# Patient Record
Sex: Female | Born: 1950 | Race: Black or African American | Hispanic: No | Marital: Single | Smoking: Current some day smoker
Health system: Southern US, Community
[De-identification: ages and names within clinical notes are randomized; demographics above are authoritative.]

## PROBLEM LIST (undated history)

## (undated) DIAGNOSIS — I517 Cardiomegaly: Secondary | ICD-10-CM

## (undated) DIAGNOSIS — I509 Heart failure, unspecified: Secondary | ICD-10-CM

## (undated) DIAGNOSIS — K219 Gastro-esophageal reflux disease without esophagitis: Secondary | ICD-10-CM

## (undated) DIAGNOSIS — I10 Essential (primary) hypertension: Secondary | ICD-10-CM

## (undated) DIAGNOSIS — J209 Acute bronchitis, unspecified: Secondary | ICD-10-CM

## (undated) DIAGNOSIS — R7302 Impaired glucose tolerance (oral): Secondary | ICD-10-CM

## (undated) DIAGNOSIS — Z8639 Personal history of other endocrine, nutritional and metabolic disease: Secondary | ICD-10-CM

## (undated) DIAGNOSIS — D649 Anemia, unspecified: Secondary | ICD-10-CM

## (undated) DIAGNOSIS — F1990 Other psychoactive substance use, unspecified, uncomplicated: Secondary | ICD-10-CM

## (undated) DIAGNOSIS — E785 Hyperlipidemia, unspecified: Secondary | ICD-10-CM

## (undated) DIAGNOSIS — E119 Type 2 diabetes mellitus without complications: Secondary | ICD-10-CM

## (undated) DIAGNOSIS — J449 Chronic obstructive pulmonary disease, unspecified: Secondary | ICD-10-CM

## (undated) DIAGNOSIS — C9590 Leukemia, unspecified not having achieved remission: Secondary | ICD-10-CM

## (undated) DIAGNOSIS — R202 Paresthesia of skin: Secondary | ICD-10-CM

## (undated) DIAGNOSIS — F419 Anxiety disorder, unspecified: Secondary | ICD-10-CM

## (undated) DIAGNOSIS — I447 Left bundle-branch block, unspecified: Secondary | ICD-10-CM

## (undated) DIAGNOSIS — Z780 Asymptomatic menopausal state: Secondary | ICD-10-CM

## (undated) DIAGNOSIS — I639 Cerebral infarction, unspecified: Secondary | ICD-10-CM

## (undated) DIAGNOSIS — R7303 Prediabetes: Secondary | ICD-10-CM

## (undated) HISTORY — PX: CARDIAC CATHETERIZATION: SHX172

## (undated) HISTORY — DX: Personal history of other endocrine, nutritional and metabolic disease: Z86.39

## (undated) HISTORY — DX: Essential (primary) hypertension: I10

## (undated) HISTORY — DX: Chronic obstructive pulmonary disease, unspecified: J44.9

## (undated) HISTORY — DX: Gastro-esophageal reflux disease without esophagitis: K21.9

## (undated) HISTORY — PX: CHOLECYSTECTOMY: SHX55

## (undated) HISTORY — DX: Cerebral infarction, unspecified: I63.9

## (undated) HISTORY — DX: Hyperlipidemia, unspecified: E78.5

## (undated) HISTORY — DX: Type 2 diabetes mellitus without complications: E11.9

## (undated) HISTORY — DX: Paresthesia of skin: R20.2

## (undated) HISTORY — DX: Other psychoactive substance use, unspecified, uncomplicated: F19.90

## (undated) HISTORY — DX: Impaired glucose tolerance (oral): R73.02

## (undated) HISTORY — DX: Cardiomegaly: I51.7

## (undated) HISTORY — DX: Asymptomatic menopausal state: Z78.0

## (undated) HISTORY — DX: Acute bronchitis, unspecified: J20.9

---

## 2009-07-26 HISTORY — PX: CHOLECYSTECTOMY: SHX55

## 2018-04-24 DIAGNOSIS — M19012 Primary osteoarthritis, left shoulder: Secondary | ICD-10-CM

## 2018-04-24 HISTORY — DX: Primary osteoarthritis, left shoulder: M19.012

## 2018-05-01 DIAGNOSIS — M1611 Unilateral primary osteoarthritis, right hip: Secondary | ICD-10-CM

## 2018-05-01 HISTORY — DX: Unilateral primary osteoarthritis, right hip: M16.11

## 2018-08-17 ENCOUNTER — Encounter: Payer: Self-pay | Admitting: *Deleted

## 2018-08-29 ENCOUNTER — Other Ambulatory Visit: Payer: Self-pay

## 2018-08-29 ENCOUNTER — Encounter: Payer: Self-pay | Admitting: Cardiovascular Disease

## 2018-08-29 ENCOUNTER — Ambulatory Visit (INDEPENDENT_AMBULATORY_CARE_PROVIDER_SITE_OTHER): Payer: Medicare Other | Admitting: Cardiovascular Disease

## 2018-08-29 DIAGNOSIS — I519 Heart disease, unspecified: Secondary | ICD-10-CM

## 2018-08-29 DIAGNOSIS — I340 Nonrheumatic mitral (valve) insufficiency: Secondary | ICD-10-CM

## 2018-08-29 DIAGNOSIS — Z720 Tobacco use: Secondary | ICD-10-CM

## 2018-08-29 DIAGNOSIS — I10 Essential (primary) hypertension: Secondary | ICD-10-CM

## 2018-08-29 DIAGNOSIS — I110 Hypertensive heart disease with heart failure: Secondary | ICD-10-CM

## 2018-08-29 HISTORY — DX: Tobacco use: Z72.0

## 2018-08-29 HISTORY — DX: Hypertensive heart disease with heart failure: I11.0

## 2018-08-29 HISTORY — DX: Heart disease, unspecified: I51.9

## 2018-08-29 HISTORY — DX: Nonrheumatic mitral (valve) insufficiency: I34.0

## 2018-08-29 NOTE — Assessment & Plan Note (Signed)
Yvonne Campbell was referred to me by Dr. Claudie Leach for right left heart cath because of severe LV left ventricular dysfunction by 2D echo with an EF of 25 to 30%.  The echo was performed 07/25/2018.  She does have class II symptoms.

## 2018-08-29 NOTE — Assessment & Plan Note (Signed)
25-pack-year tobacco abuse with continuing smoking 1/2 pack/day recalcitrant to risk factor modification.

## 2018-08-29 NOTE — H&P (View-Only) (Signed)
08/29/2018 [place]   May 25, 1951  [id number]  Primary Physician No primary care provider on file. Primary Cardiologist: Lorretta Harp MD Yvonne Campbell, Yvonne Campbell, [place]  HPI:  Yvonne Campbell is a 68 y.o. thin appearing divorced African-American female mother of 36, grandmother of 63 grandchildren who is accompanied by her daughter Yvonne Campbell today.  She was referred by Dr. Claudie Leach for right left heart cath because of symptomatic severe LV dysfunction with severe MR.  Her risk factors include treated hypertension and a long history tobacco abuse.  She is never had a heart attack stroke.  She does complain of progressive dyspnea now one half block.  2D echo performed 07/25/2018 revealed severe LV dysfunction with an EF of 25% and severe MR.   Current Meds  Medication Sig  . carvedilol (COREG) 12.5 MG tablet Take 12.5 mg by mouth 2 (two) times daily.  . cyclobenzaprine (FLEXERIL) 10 MG tablet Take 10 mg by mouth as needed.  Marland Kitchen lisinopril (PRINIVIL,ZESTRIL) 10 MG tablet Take 10 mg by mouth 2 (two) times daily.   Marland Kitchen omeprazole (PRILOSEC) 20 MG capsule Take 20 mg by mouth daily.  . [DISCONTINUED] permethrin (ELIMITE) 5 % cream Apply 1 application topically once.  . [DISCONTINUED] sulfamethoxazole-trimethoprim (BACTRIM DS,SEPTRA DS) 800-160 MG tablet Take 1 tablet by mouth 2 (two) times daily.     Allergies  Allergen Reactions  . Sulfa Antibiotics   . Antiseborrheic Products, Misc. Swelling  . Latex Rash  . Penicillins Swelling    Social History   Socioeconomic History  . Marital status: Single    Spouse name: Not on file  . Number of children: Not on file  . Years of education: Not on file  . Highest education level: Not on file  Occupational History  . Not on file  Social Needs  . Financial resource strain: Not on file  . Food insecurity:    Worry: Not on file    Inability: Not on file  . Transportation needs:    Medical: Not on file    Non-medical: Not on file  Tobacco Use  .  Smoking status: Former Smoker    Last attempt to quit: 07/26/2018    Years since quitting: 0.0  . Smokeless tobacco: Never Used  Substance and Sexual Activity  . Alcohol use: Yes  . Drug use: Never  . Sexual activity: Not on file  Lifestyle  . Physical activity:    Days per week: Not on file    Minutes per session: Not on file  . Stress: Not on file  Relationships  . Social connections:    Talks on phone: Not on file    Gets together: Not on file    Attends religious service: Not on file    Active member of club or organization: Not on file    Attends meetings of clubs or organizations: Not on file    Relationship status: Not on file  . Intimate partner violence:    Fear of current or ex partner: Not on file    Emotionally abused: Not on file    Physically abused: Not on file    Forced sexual activity: Not on file  Other Topics Concern  . Not on file  Social History Narrative  . Not on file     Review of Systems: General: negative for chills, fever, night sweats or weight changes.  Cardiovascular: negative for chest pain, dyspnea on exertion, edema, orthopnea, palpitations, paroxysmal nocturnal dyspnea or shortness  of breath Dermatological: negative for rash Respiratory: negative for cough or wheezing Urologic: negative for hematuria Abdominal: negative for nausea, vomiting, diarrhea, bright red blood per rectum, melena, or hematemesis Neurologic: negative for visual changes, syncope, or dizziness All other systems reviewed and are otherwise negative except as noted above.    Blood pressure 132/74, pulse 63, height 5\' 4"  (1.626 m), weight 129 lb 6.4 oz (58.7 kg).  General appearance: alert and no distress Neck: no adenopathy, no carotid bruit, no JVD, supple, symmetrical, trachea midline and thyroid not enlarged, symmetric, no tenderness/mass/nodules Lungs: clear to auscultation bilaterally Heart: regular rate and rhythm, S1, S2 normal, no murmur, click, rub or  gallop Extremities: extremities normal, atraumatic, no cyanosis or edema Pulses: 2+ and symmetric Skin: Skin color, texture, turgor normal. No rashes or lesions Neurologic: Alert and oriented X 3, normal strength and tone. Normal symmetric reflexes. Normal coordination and gait  EKG sinus rhythm at 63 with severe LVH with repolarization changes.  I personally reviewed this EKG.  ASSESSMENT AND PLAN:   Left ventricular dysfunction Ms. Whittingham was referred to me by Dr. Claudie Leach for right left heart cath because of severe LV left ventricular dysfunction by 2D echo with an EF of 25 to 30%.  The echo was performed 07/25/2018.  She does have class II symptoms.  Severe mitral regurgitation 2D echo performed 07/25/2018 showed severe LV dysfunction with severe MR.  The patient does have class II symptoms.  Will perform outpatient right and left heart cath via the radial/brachial approach.  Tobacco abuse 25-pack-year tobacco abuse with continuing smoking 1/2 pack/day recalcitrant to risk factor modification.  Essential hypertension History of essential hypertension with blood pressure measured today at 132/74.  She is on carvedilol and lisinopril.      Lorretta Harp MD FACP,FACC,FAHA, [facility] 08/29/2018 10:42 AM

## 2018-08-29 NOTE — Assessment & Plan Note (Signed)
History of essential hypertension with blood pressure measured today at 132/74.  She is on carvedilol and lisinopril.

## 2018-08-29 NOTE — Assessment & Plan Note (Signed)
2D echo performed 07/25/2018 showed severe LV dysfunction with severe MR.  The patient does have class II symptoms.  Will perform outpatient right and left heart cath via the radial/brachial approach.

## 2018-08-29 NOTE — Progress Notes (Signed)
08/29/2018 Yvonne Campbell   [birth year]  [id number]  Primary Physician No primary care provider on file. Primary Cardiologist: Lorretta Harp MD Yvonne Campbell, [place], [place]  HPI:  Yvonne Campbell is a 68 y.o. thin appearing divorced African-American female mother of 38, grandmother of 67 grandchildren who is accompanied by her daughter Yvonne Campbell today.  She was referred by Dr. Claudie Campbell for right left heart cath because of symptomatic severe LV dysfunction with severe MR.  Her risk factors include treated hypertension and a long history tobacco abuse.  She is never had a heart attack stroke.  She does complain of progressive dyspnea now one half block.  2D echo performed 07/25/2018 revealed severe LV dysfunction with an EF of 25% and severe MR.   Current Meds  Medication Sig  . carvedilol (COREG) 12.5 MG tablet Take 12.5 mg by mouth 2 (two) times daily.  . cyclobenzaprine (FLEXERIL) 10 MG tablet Take 10 mg by mouth as needed.  Marland Kitchen lisinopril (PRINIVIL,ZESTRIL) 10 MG tablet Take 10 mg by mouth 2 (two) times daily.   Marland Kitchen omeprazole (PRILOSEC) 20 MG capsule Take 20 mg by mouth daily.  . [DISCONTINUED] permethrin (ELIMITE) 5 % cream Apply 1 application topically once.  . [DISCONTINUED] sulfamethoxazole-trimethoprim (BACTRIM DS,SEPTRA DS) 800-160 MG tablet Take 1 tablet by mouth 2 (two) times daily.     Allergies  Allergen Reactions  . Sulfa Antibiotics   . Antiseborrheic Products, Misc. Swelling  . Latex Rash  . Penicillins Swelling    Social History   Socioeconomic History  . Marital status: Single    Spouse name: Not on file  . Number of children: Not on file  . Years of education: Not on file  . Highest education level: Not on file  Occupational History  . Not on file  Social Needs  . Financial resource strain: Not on file  . Food insecurity:    Worry: Not on file    Inability: Not on file  . Transportation needs:    Medical: Not on file    Non-medical: Not on file  Tobacco Use  .  Smoking status: Former Smoker    Last attempt to quit: 07/26/2018    Years since quitting: 0.0  . Smokeless tobacco: Never Used  Substance and Sexual Activity  . Alcohol use: Yes  . Drug use: Never  . Sexual activity: Not on file  Lifestyle  . Physical activity:    Days per week: Not on file    Minutes per session: Not on file  . Stress: Not on file  Relationships  . Social connections:    Talks on phone: Not on file    Gets together: Not on file    Attends religious service: Not on file    Active member of club or organization: Not on file    Attends meetings of clubs or organizations: Not on file    Relationship status: Not on file  . Intimate partner violence:    Fear of current or ex partner: Not on file    Emotionally abused: Not on file    Physically abused: Not on file    Forced sexual activity: Not on file  Other Topics Concern  . Not on file  Social History Narrative  . Not on file     Review of Systems: General: negative for chills, fever, night sweats or weight changes.  Cardiovascular: negative for chest pain, dyspnea on exertion, edema, orthopnea, palpitations, paroxysmal nocturnal dyspnea or shortness  of breath Dermatological: negative for rash Respiratory: negative for cough or wheezing Urologic: negative for hematuria Abdominal: negative for nausea, vomiting, diarrhea, bright red blood per rectum, melena, or hematemesis Neurologic: negative for visual changes, syncope, or dizziness All other systems reviewed and are otherwise negative except as noted above.    Blood pressure 132/74, pulse 63, height 5\' 4"  (1.626 m), weight 129 lb 6.4 oz (58.7 kg).  General appearance: alert and no distress Neck: no adenopathy, no carotid bruit, no JVD, supple, symmetrical, trachea midline and thyroid not enlarged, symmetric, no tenderness/mass/nodules Lungs: clear to auscultation bilaterally Heart: regular rate and rhythm, S1, S2 normal, no murmur, click, rub or  gallop Extremities: extremities normal, atraumatic, no cyanosis or edema Pulses: 2+ and symmetric Skin: Skin color, texture, turgor normal. No rashes or lesions Neurologic: Alert and oriented X 3, normal strength and tone. Normal symmetric reflexes. Normal coordination and gait  EKG sinus rhythm at 63 with severe LVH with repolarization changes.  I personally reviewed this EKG.  ASSESSMENT AND PLAN:   Left ventricular dysfunction Ms. Keizer was referred to me by Dr. Claudie Campbell for right left heart cath because of severe LV left ventricular dysfunction by 2D echo with an EF of 25 to 30%.  The echo was performed 07/25/2018.  She does have class II symptoms.  Severe mitral regurgitation 2D echo performed 07/25/2018 showed severe LV dysfunction with severe MR.  The patient does have class II symptoms.  Will perform outpatient right and left heart cath via the radial/brachial approach.  Tobacco abuse 25-pack-year tobacco abuse with continuing smoking 1/2 pack/day recalcitrant to risk factor modification.  Essential hypertension History of essential hypertension with blood pressure measured today at 132/74.  She is on carvedilol and lisinopril.      Lorretta Harp MD FACP,FACC,FAHA, [facility] 08/29/2018 10:42 AM

## 2018-08-29 NOTE — Patient Instructions (Signed)
    [place] [address] Dept: [phone] Loc: [place]  08/29/2018  You are scheduled for a Cardiac Catheterization on Monday, February 10 with Dr. Quay Burow.  1. Please arrive at the [facility] (Main Entrance A) at [facility]: [address] [place], [place] [address] at 9:30 AM (This time is two hours before your procedure to ensure your preparation). Free valet parking service is available.   Special note: Every effort is made to have your procedure done on time. Please understand that emergencies sometimes delay scheduled procedures.  2. Diet: Do not eat solid foods after midnight.  The patient may have clear liquids until 5am upon the day of the procedure.  3. Labs: You will need to have blood drawn TODAY CBC, BMP, TSH 4. Medication instructions in preparation for your procedure:   On the morning of your procedure, take one tablet of Aspirin 81 MG and any morning medicines NOT listed above.  You may use sips of water.  5. Plan for one night stay--bring personal belongings. 6. Bring a current list of your medications and current insurance cards. 7. You MUST have a responsible person to drive you home. 8. Someone MUST be with you the first 24 hours after you arrive home or your discharge will be delayed. 9. Please wear clothes that are easy to get on and off and wear slip-on shoes.  Thank you for allowing [place] to care for you!   -- [facility]   FOLLOW UP: PLEASE SCHEDULE A FOLLOW UP APPOINTMENT 3 WEEKS AFTER PROCEDURE.

## 2018-08-30 LAB — BASIC METABOLIC PANEL
BUN/Creatinine Ratio: 17 (ref 12–28)
BUN: 17 mg/dL (ref 8–27)
CO2: 23 mmol/L (ref 20–29)
Calcium: 9.3 mg/dL (ref 8.7–10.3)
Chloride: 103 mmol/L (ref 96–106)
Creatinine, Ser: 1.03 mg/dL — ABNORMAL HIGH (ref 0.57–1.00)
GFR calc Af Amer: 65 mL/min/{1.73_m2} (ref >59)
GFR calc non Af Amer: 56 mL/min/{1.73_m2} — ABNORMAL LOW (ref >59)
Glucose: 72 mg/dL (ref 65–99)
Potassium: 4.9 mmol/L (ref 3.5–5.2)
Sodium: 144 mmol/L (ref 134–144)

## 2018-08-30 LAB — CBC
Hematocrit: 40.9 % (ref 34.0–46.6)
Hemoglobin: 13.5 g/dL (ref 11.1–15.9)
MCH: 30.5 pg (ref 26.6–33.0)
MCHC: 33 g/dL (ref 31.5–35.7)
MCV: 92 fL (ref 79–97)
PLATELETS: 303 10*3/uL (ref 150–450)
RBC: 4.43 x10E6/uL (ref 3.77–5.28)
RDW: 12.7 % (ref 11.7–15.4)
WBC: 5.4 10*3/uL (ref 3.4–10.8)

## 2018-08-30 LAB — TSH: TSH: 0.932 u[IU]/mL (ref 0.450–4.500)

## 2018-09-04 ENCOUNTER — Encounter (HOSPITAL_COMMUNITY)
Admission: RE | Disposition: A | Discharge status: Home / Self Care | Payer: Self-pay | Source: Home / Self Care | Attending: Cardiovascular Disease

## 2018-09-04 ENCOUNTER — Ambulatory Visit (HOSPITAL_COMMUNITY)
Admission: RE | Admit: 2018-09-04 | Discharge: 2018-09-04 | Disposition: A | Discharge status: Home / Self Care | Payer: Medicare Other | Attending: Cardiovascular Disease | Admitting: Cardiovascular Disease

## 2018-09-04 ENCOUNTER — Other Ambulatory Visit: Payer: Self-pay

## 2018-09-04 DIAGNOSIS — Z880 Allergy status to penicillin: Secondary | ICD-10-CM | POA: Insufficient documentation

## 2018-09-04 DIAGNOSIS — Z87891 Personal history of nicotine dependence: Secondary | ICD-10-CM | POA: Diagnosis not present

## 2018-09-04 DIAGNOSIS — R0600 Dyspnea, unspecified: Secondary | ICD-10-CM | POA: Diagnosis not present

## 2018-09-04 DIAGNOSIS — Z79899 Other long term (current) drug therapy: Secondary | ICD-10-CM | POA: Diagnosis not present

## 2018-09-04 DIAGNOSIS — I119 Hypertensive heart disease without heart failure: Secondary | ICD-10-CM | POA: Diagnosis present

## 2018-09-04 DIAGNOSIS — Z882 Allergy status to sulfonamides status: Secondary | ICD-10-CM | POA: Diagnosis not present

## 2018-09-04 DIAGNOSIS — Z91040 Latex allergy status: Secondary | ICD-10-CM | POA: Insufficient documentation

## 2018-09-04 DIAGNOSIS — I519 Heart disease, unspecified: Secondary | ICD-10-CM | POA: Diagnosis not present

## 2018-09-04 DIAGNOSIS — I051 Rheumatic mitral insufficiency: Secondary | ICD-10-CM | POA: Diagnosis not present

## 2018-09-04 HISTORY — PX: RIGHT/LEFT HEART CATH AND CORONARY ANGIOGRAPHY: CATH118266

## 2018-09-04 LAB — POCT I-STAT EG7
Bicarbonate: 26.4 mmol/L (ref 20.0–28.0)
Calcium, Ion: 1.25 mmol/L (ref 1.15–1.40)
HCT: 36 % (ref 36.0–46.0)
Hemoglobin: 12.2 g/dL (ref 12.0–15.0)
O2 Saturation: 73 %
Potassium: 3.9 mmol/L (ref 3.5–5.1)
SODIUM: 140 mmol/L (ref 135–145)
TCO2: 28 mmol/L (ref 22–32)
pCO2, Ven: 48.8 mmHg (ref 44.0–60.0)
pH, Ven: 7.342 (ref 7.250–7.430)
pO2, Ven: 42 mmHg (ref 32.0–45.0)

## 2018-09-04 LAB — POCT I-STAT 7, (LYTES, BLD GAS, ICA,H+H)
Bicarbonate: 25.2 mmol/L (ref 20.0–28.0)
Calcium, Ion: 1.24 mmol/L (ref 1.15–1.40)
HCT: 36 % (ref 36.0–46.0)
Hemoglobin: 12.2 g/dL (ref 12.0–15.0)
O2 Saturation: 97 %
PH ART: 7.362 (ref 7.350–7.450)
Potassium: 4 mmol/L (ref 3.5–5.1)
Sodium: 141 mmol/L (ref 135–145)
TCO2: 27 mmol/L (ref 22–32)
pCO2 arterial: 44.4 mmHg (ref 32.0–48.0)
pO2, Arterial: 98 mmHg (ref 83.0–108.0)

## 2018-09-04 LAB — GLUCOSE, CAPILLARY: Glucose-Capillary: 91 mg/dL (ref 70–99)

## 2018-09-04 SURGERY — RIGHT/LEFT HEART CATH AND CORONARY ANGIOGRAPHY
Anesthesia: LOCAL

## 2018-09-04 MED ORDER — SODIUM CHLORIDE 0.9 % IV SOLN: INTRAVENOUS | Status: AC

## 2018-09-04 MED ORDER — HEPARIN SODIUM (PORCINE) 1000 UNIT/ML IJ SOLN
INTRAMUSCULAR | Status: AC
  Filled 2018-09-04: qty 1

## 2018-09-04 MED ORDER — HEPARIN (PORCINE) IN NACL 1000-0.9 UT/500ML-% IV SOLN
INTRAVENOUS | Status: DC | PRN
  Administered 2018-09-04: 500 mL

## 2018-09-04 MED ORDER — SODIUM CHLORIDE 0.9% FLUSH: 3.0000 mL | INTRAVENOUS | Status: DC | PRN

## 2018-09-04 MED ORDER — MIDAZOLAM HCL 2 MG/2ML IJ SOLN
INTRAMUSCULAR | Status: AC
  Filled 2018-09-04: qty 2

## 2018-09-04 MED ORDER — LIDOCAINE HCL (PF) 1 % IJ SOLN
INTRAMUSCULAR | Status: DC | PRN
  Administered 2018-09-04: 10 mL

## 2018-09-04 MED ORDER — ASPIRIN 81 MG PO CHEW: 81.0000 mg | CHEWABLE_TABLET | Freq: Every day | ORAL | Status: DC

## 2018-09-04 MED ORDER — VERAPAMIL HCL 2.5 MG/ML IV SOLN
INTRAVENOUS | Status: DC | PRN
  Administered 2018-09-04: 10 mL via INTRA_ARTERIAL

## 2018-09-04 MED ORDER — SODIUM CHLORIDE 0.9% FLUSH: 3.0000 mL | Freq: Two times a day (BID) | INTRAVENOUS | Status: DC

## 2018-09-04 MED ORDER — ACETAMINOPHEN 325 MG PO TABS: 650.0000 mg | ORAL_TABLET | ORAL | Status: DC | PRN

## 2018-09-04 MED ORDER — FENTANYL CITRATE (PF) 100 MCG/2ML IJ SOLN
INTRAMUSCULAR | Status: AC
  Filled 2018-09-04: qty 2

## 2018-09-04 MED ORDER — MIDAZOLAM HCL 2 MG/2ML IJ SOLN
INTRAMUSCULAR | Status: DC | PRN
  Administered 2018-09-04: 1 mg via INTRAVENOUS

## 2018-09-04 MED ORDER — SODIUM CHLORIDE 0.9 % WEIGHT BASED INFUSION: 1.0000 mL/kg/h | INTRAVENOUS | Status: DC

## 2018-09-04 MED ORDER — ONDANSETRON HCL 4 MG/2ML IJ SOLN: 4.0000 mg | Freq: Four times a day (QID) | INTRAMUSCULAR | Status: DC | PRN

## 2018-09-04 MED ORDER — LIDOCAINE HCL (PF) 1 % IJ SOLN
INTRAMUSCULAR | Status: AC
  Filled 2018-09-04: qty 30

## 2018-09-04 MED ORDER — ASPIRIN 81 MG PO CHEW: 81.0000 mg | CHEWABLE_TABLET | ORAL | Status: DC

## 2018-09-04 MED ORDER — FENTANYL CITRATE (PF) 100 MCG/2ML IJ SOLN
INTRAMUSCULAR | Status: DC | PRN
  Administered 2018-09-04: 25 ug via INTRAVENOUS

## 2018-09-04 MED ORDER — HEPARIN (PORCINE) IN NACL 1000-0.9 UT/500ML-% IV SOLN
INTRAVENOUS | Status: AC
  Filled 2018-09-04: qty 1000

## 2018-09-04 MED ORDER — SODIUM CHLORIDE 0.9 % IV SOLN: 250.0000 mL | INTRAVENOUS | Status: DC | PRN

## 2018-09-04 MED ORDER — SODIUM CHLORIDE 0.9 % WEIGHT BASED INFUSION
3.0000 mL/kg/h | INTRAVENOUS | Status: AC
  Administered 2018-09-04: 3 mL/kg/h via INTRAVENOUS

## 2018-09-04 SURGICAL SUPPLY — 15 items
CATH BALLN WEDGE 5F 110CM (CATHETERS) ×4 IMPLANT
CATH INFINITI 5FR ANG PIGTAIL (CATHETERS) ×2 IMPLANT
CATH OPTITORQUE TIG 4.0 5F (CATHETERS) ×2 IMPLANT
DEVICE RAD COMP TR BAND LRG (VASCULAR PRODUCTS) ×2 IMPLANT
GLIDESHEATH SLEND A-KIT 6F 22G (SHEATH) ×2 IMPLANT
GUIDEWIRE INQWIRE 1.5J.035X260 (WIRE) ×1 IMPLANT
INQWIRE 1.5J .035X260CM (WIRE) ×2
KIT HEART LEFT (KITS) ×2 IMPLANT
PACK CARDIAC CATHETERIZATION (CUSTOM PROCEDURE TRAY) ×2 IMPLANT
SHEATH GLIDE SLENDER 4/5FR (SHEATH) ×2 IMPLANT
SHEATH PROBE COVER 6X72 (BAG) ×2 IMPLANT
SYR MEDRAD MARK 7 150ML (SYRINGE) ×2 IMPLANT
TRANSDUCER W/STOPCOCK (MISCELLANEOUS) ×2 IMPLANT
TUBING CIL FLEX 10 FLL-RA (TUBING) ×2 IMPLANT
WIRE HI TORQ VERSACORE-J 145CM (WIRE) ×2 IMPLANT

## 2018-09-04 NOTE — Interval H&P Note (Signed)
Cath Lab Visit (complete for each Cath Lab visit)  Clinical Evaluation Leading to the Procedure:   ACS: No.  Non-ACS:    Anginal Classification: CCS I  Anti-ischemic medical therapy: No Therapy  Non-Invasive Test Results: No non-invasive testing performed  Prior CABG: No previous CABG      History and Physical Interval Note:  09/04/2018 12:12 PM  [facility]  has presented today for surgery, with the diagnosis of lv disfunction  The various methods of treatment have been discussed with the patient and family. After consideration of risks, benefits and other options for treatment, the patient has consented to  Procedure(s): RIGHT/LEFT HEART CATH AND CORONARY ANGIOGRAPHY (N/A) as a surgical intervention .  The patient's history has been reviewed, patient examined, no change in status, stable for surgery.  I have reviewed the patient's chart and labs.  Questions were answered to the patient's satisfaction.     Quay Burow

## 2018-09-04 NOTE — Discharge Instructions (Signed)

## 2018-09-05 ENCOUNTER — Encounter (HOSPITAL_COMMUNITY): Payer: Self-pay | Admitting: Cardiovascular Disease

## 2018-09-26 ENCOUNTER — Ambulatory Visit: Payer: Medicare Other | Admitting: Cardiovascular Disease

## 2018-09-27 ENCOUNTER — Encounter: Payer: Self-pay | Admitting: *Deleted

## 2018-10-23 DIAGNOSIS — J189 Pneumonia, unspecified organism: Secondary | ICD-10-CM

## 2018-10-23 HISTORY — DX: Pneumonia, unspecified organism: J18.9

## 2018-11-07 ENCOUNTER — Ambulatory Visit: Payer: Medicare Other | Admitting: Cardiovascular Disease

## 2018-11-08 ENCOUNTER — Ambulatory Visit: Payer: Medicare Other | Admitting: Cardiovascular Disease

## 2018-11-22 DIAGNOSIS — R040 Epistaxis: Secondary | ICD-10-CM

## 2018-11-22 HISTORY — DX: Epistaxis: R04.0

## 2018-12-26 ENCOUNTER — Telehealth: Payer: Self-pay | Admitting: *Deleted

## 2018-12-26 NOTE — Telephone Encounter (Signed)
REFERRAL SENT TO SCHEDULING AND NOTES ON FILE FROM [facility] DR. RAYMOND ROSARIO  [phone]. °

## 2019-01-04 NOTE — Progress Notes (Signed)
Cardiology Office Note:    Date:  01/05/2019   ID:  Yvonne Campbell, Yvonne Campbell [birth year], MRN [id number]  PCP:  Yvonne Mimes, MD  Cardiologist:  Yvonne More, MD   Referring MD: Yvonne Mimes, MD  ASSESSMENT:    1. Left ventricular dysfunction   2. Hypertensive heart disease with heart failure ([place])   3. Severe mitral regurgitation    PLAN:    In order of problems listed above:  1. Left ventricular dysfunction she has a severe nonischemic cardiomyopathy in the setting of longstanding hypertension.  Her home blood pressure still running greater than 150 and I will add this distal diuretic spironolactone to her guideline directed beta-blocker and Entresto I suspect blood pressure will come into line and there is additional benefit in terms of systolic heart failure today check renal function proBNP and recheck renal function in 2 weeks.  She is well compensated at the time of her heart catheterization with a mean pulmonary capillary wedge pressure of 11.  Unless she had significant ventricular arrhythmia I would not advise an ICD in a woman with a nonischemic cardiomyopathy 2. Hypertension remains elevated at MRA 3. Mitral regurgitation severe she is [place] Heart Association class I at this time I would not consider a mitral clip I suspect that she will have a follow-up echocardiogram performed when she sees Dr. Claudie Campbell LE further follow-up with me as needed.  Next appointment   Medication Adjustments/Labs and Tests Ordered: Current medicines are reviewed at length with the patient today.  Concerns regarding medicines are outlined above.  No orders of the defined types were placed in this encounter.  No orders of the defined types were placed in this encounter.    No chief complaint on file.   History of Present Illness:    Yvonne Campbell is a 68 y.o. female who is being seen today for the evaluation of heart failure at the request of Yvonne Mimes, MD. PMH HTN,  tobacco abuse, severe LV dysfunction, severe MR.   She had R and L heart cath 09/04/18 with Dr berry which showed normal coronary arteries, severe LV systolic dysfunction EF less than 25% and LV end diastolic pressure moderately elevated. Echocardiogram 07/25/2018 EF 25%, severe LV dysfunction, severe MR.  She had an appointment to see Dr. Alvester Chou after her heart catheterization she is in my office hours today.  Overall she is really doing quite well she is pleased with the quality of her life does not need a diuretic and is not having edema shortness of breath or exercise intolerance she walks a mile each day.  She is on good guideline directed therapy and that and added MRA with her elevated blood pressure she is wearing a 30-day event monitor to screen for ventricular arrhythmia and she tells me she will be seeing Dr. Claudie Campbell in the next month and plans to have a repeat echocardiogram I will leave further follow-up in my office as needed. Past Medical History:  Diagnosis Date  . Acute bronchitis   . Diabetes mellitus without complication ([place])   . Drug use   . Hyperlipidemia   . Hypertension   . Impaired glucose tolerance   . LVH (left ventricular hypertrophy)   . Menopause     Past Surgical History:  Procedure Laterality Date  . CARDIAC CATHETERIZATION    . CESAREAN SECTION    . CHOLECYSTECTOMY    . RIGHT/LEFT HEART CATH AND CORONARY ANGIOGRAPHY N/A 09/04/2018   Procedure: RIGHT/LEFT  HEART CATH AND CORONARY ANGIOGRAPHY;  Surgeon: Lorretta Harp, MD;  Location: [facility];  Service: Cardiovascular;  Laterality: N/A;    Current Medications: Current Meds  Medication Sig  . albuterol (VENTOLIN HFA) 108 (90 Base) MCG/ACT inhaler Inhale 1-2 puffs into the lungs every 4 (four) hours as needed.  Marland Kitchen aspirin EC 81 MG tablet Take 1 tablet by mouth daily.  . carvedilol (COREG) 12.5 MG tablet Take 12.5 mg by mouth 2 (two) times daily.  . cetirizine (ZYRTEC) 10 MG tablet Take 10 mg by  mouth daily.  . cyclobenzaprine (FLEXERIL) 10 MG tablet Take 10 mg by mouth 3 (three) times daily as needed for muscle spasms.   Marland Kitchen ENTRESTO 97-103 MG Take 1 tablet by mouth 2 (two) times a day.  . hydrOXYzine (ATARAX/VISTARIL) 25 MG tablet Take 1 tablet by mouth at bedtime.  Marland Kitchen omeprazole (PRILOSEC) 20 MG capsule Take 20 mg by mouth daily as needed.   Marland Kitchen PROAIR HFA 108 (90 Base) MCG/ACT inhaler 2 puffs every 6 (six) hours as needed.   Marland Kitchen SINGULAIR 10 MG tablet Take 1 tablet by mouth daily.  . sodium chloride (ALTAMIST SPRAY) 0.65 % nasal spray Place 2 sprays into the nose as needed.      Allergies:   Antiseborrheic products, misc.; Latex; Penicillins; and Sulfa antibiotics   Social History   Socioeconomic History  . Marital status: Single    Spouse name: Not on file  . Number of children: Not on file  . Years of education: Not on file  . Highest education level: Not on file  Occupational History  . Not on file  Social Needs  . Financial resource strain: Not on file  . Food insecurity    Worry: Not on file    Inability: Not on file  . Transportation needs    Medical: Not on file    Non-medical: Not on file  Tobacco Use  . Smoking status: Former Smoker    Packs/day: 0.50    Years: 35.00    Pack years: 17.50    Types: Cigarettes    Quit date: 10/2018    Years since quitting: 0.1  . Smokeless tobacco: Never Used  Substance and Sexual Activity  . Alcohol use: Not Currently  . Drug use: Yes    Types: Marijuana  . Sexual activity: Not on file  Lifestyle  . Physical activity    Days per week: Not on file    Minutes per session: Not on file  . Stress: Not on file  Relationships  . Social Herbalist on phone: Not on file    Gets together: Not on file    Attends religious service: Not on file    Active member of club or organization: Not on file    Attends meetings of clubs or organizations: Not on file    Relationship status: Not on file  Other Topics Concern  .  Not on file  Social History Narrative  . Not on file     Family History: The patient's family history includes Breast cancer in her mother; Heart attack in her paternal grandfather; Hypertension in her mother and paternal grandfather.  ROS:   ROS Please see the history of present illness.     All other systems reviewed and are negative.  EKGs/Labs/Other Studies Reviewed:    The following studies were reviewed today:  Cardiac catheterization 09/04/18: IMPRESSION: Ms. Feria has a dilated nonischemic cardiomyopathy with an EF in  the 20% range, clean coronary arteries, cardiac index of 3 L/min/m and fairly normal filling pressures.  Medical therapy will be recommended.  The right radial artery sheath was removed and a TR band was placed on the right wrist to achieve patent hemostasis.  The patient left the lab in stable condition.  She will be discharged home later today as an outpatient and follow-up with Dr. Claudie Campbell who has been notified of these results.  Findings Left VentricleThe left ventricle is dilated. There is severe left ventricular systolic dysfunction. LV end diastolic pressure is moderately elevated. The left ventricular ejection fraction is less than 25% by visual estimate. There are LV function abnormalities.  Right Heart Pressures Right atrial pressure- 7/6 Right ventricular pressure- 38/2 Pulmonary artery pressure-36/11, mean 20 Pulmonary wedge pressure-A-wave 15, V wave 15, mean 11 LVEDP- 17 Cardiac output- 4.92 L/min    EKG:  EKG is  ordered today.  The ekg ordered today is personally reviewed and demonstrates [place] LBBB repolarization changes  Recent Labs: 08/29/2018: BUN 17; Creatinine, Ser 1.03; Platelets 303; TSH 0.932 09/04/2018: Hemoglobin 12.2; Potassium 4.0; Sodium 141  Recent Lipid Panel No results found for: CHOL, TRIG, HDL, CHOLHDL, VLDL, LDLCALC, LDLDIRECT  Physical Exam:    VS:  BP (!) 168/88 (BP Location: Left Arm, Patient Position: Sitting, Cuff Size:  Normal)   Pulse 68   Temp (!) 97.2 F (36.2 C)   Ht 5\' 4"  (1.626 m)   Wt 129 lb 6.4 oz (58.7 kg)   SpO2 98%   BMI 22.21 kg/m     Wt Readings from Last 3 Encounters:  01/05/19 129 lb 6.4 oz (58.7 kg)  09/04/18 129 lb (58.5 kg)  08/29/18 129 lb 6.4 oz (58.7 kg)     GEN:  Well nourished, well developed in no acute distress HEENT: Normal NECK: No JVD; No carotid bruits LYMPHATICS: No lymphadenopathy CARDIAC: S2 paradoxical RRR, no murmurs, rubs, gallops RESPIRATORY:  Clear to auscultation without rales, wheezing or rhonchi  ABDOMEN: Soft, non-tender, non-distended MUSCULOSKELETAL:  No edema; No deformity  SKIN: Warm and dry NEUROLOGIC:  Alert and oriented x 3 PSYCHIATRIC:  Normal affect     Signed, Yvonne More, MD  01/05/2019 4:00 PM    [facility]

## 2019-01-05 ENCOUNTER — Encounter: Payer: Self-pay | Admitting: Cardiology

## 2019-01-05 ENCOUNTER — Encounter: Payer: Self-pay | Admitting: *Deleted

## 2019-01-05 ENCOUNTER — Other Ambulatory Visit: Payer: Self-pay

## 2019-01-05 ENCOUNTER — Ambulatory Visit (INDEPENDENT_AMBULATORY_CARE_PROVIDER_SITE_OTHER): Payer: Medicare Other | Admitting: Cardiology

## 2019-01-05 VITALS — BP 168/88 | HR 68 | Temp 97.2°F | Ht 64.0 in | Wt 129.4 lb

## 2019-01-05 DIAGNOSIS — I110 Hypertensive heart disease with heart failure: Secondary | ICD-10-CM | POA: Diagnosis not present

## 2019-01-05 DIAGNOSIS — I340 Nonrheumatic mitral (valve) insufficiency: Secondary | ICD-10-CM

## 2019-01-05 DIAGNOSIS — I519 Heart disease, unspecified: Secondary | ICD-10-CM

## 2019-01-05 MED ORDER — SPIRONOLACTONE 25 MG PO TABS: 25.0000 mg | ORAL_TABLET | Freq: Every day | ORAL | 2 refills | Status: DC

## 2019-01-05 NOTE — Patient Instructions (Addendum)
Medication Instructions:  Your physician has recommended you make the following change in your medication:   START: Spironolactone 25mg  (1 Tab) daily  If you need a refill on your cardiac medications before your next appointment, please call your pharmacy.   Lab work: Your physician recommends that you return for lab work in: TODAY BMP,Pro BNP  IN 2 WEEKS: BMP  If you have labs (blood work) drawn today and your tests are completely normal, you will receive your results only by: Marland Kitchen MyChart Message (if you have MyChart) OR . A paper copy in the mail If you have any lab test that is abnormal or we need to change your treatment, we will call you to review the results.  Testing/Procedures: None  Follow-Up: At [facility], you and your health needs are our priority.  As part of our continuing mission to provide you with exceptional heart care, we have created designated Provider Care Teams.  These Care Teams include your primary Cardiologist (physician) and Advanced Practice Providers (APPs -  Physician Assistants and Nurse Practitioners) who all work together to provide you with the care you need, when you need it. You will need a follow up appointment as needed Any Other Special Instructions Will Be Listed Below (If Applicable).   Spironolactone tablets What is this medicine? SPIRONOLACTONE (speer on oh LAK tone) is a diuretic. It helps you make more urine and to lose excess water from your body. This medicine is used to treat high blood pressure, and edema or swelling from heart, kidney, or liver disease. It is also used to treat patients who make too much aldosterone or have low potassium. This medicine may be used for other purposes; ask your health care provider or pharmacist if you have questions. COMMON BRAND NAME(S): Aldactone What should I tell my health care provider before I take this medicine? They need to know if you have any of these conditions: -high blood level of  potassium -kidney disease or trouble making urine -liver disease -an unusual or allergic reaction to spironolactone, other medicines, foods, dyes, or preservatives -pregnant or trying to get pregnant -breast-feeding How should I use this medicine? Take this medicine by mouth with a drink of water. Follow the directions on your prescription label. You can take it with or without food. If it upsets your stomach, take it with food. Do not take your medicine more often than directed. Remember that you will need to pass more urine after taking this medicine. Do not take your doses at a time of day that will cause you problems. Do not take at bedtime. Talk to your pediatrician regarding the use of this medicine in children. While this drug may be prescribed for selected conditions, precautions do apply. Overdosage: If you think you have taken too much of this medicine contact a poison control center or emergency room at once. NOTE: This medicine is only for you. Do not share this medicine with others. What if I miss a dose? If you miss a dose, take it as soon as you can. If it is almost time for your next dose, take only that dose. Do not take double or extra doses. What may interact with this medicine? Do not take this medicine with any of the following medications: -cidofovir -eplerenone -tranylcypromine This medicine may also interact with the following medications: -aspirin -certain medicines for blood pressure or heart disease like benazepril, lisinopril, losartan, valsartan -certain medicines that treat or prevent blood clots like heparin and enoxaparin -cholestyramine -  cyclosporine -digoxin -lithium -medicines that relax muscles for surgery -NSAIDs, medicines for pain and inflammation, like ibuprofen or naproxen -other diuretics -potassium supplements -steroid medicines like prednisone or cortisone -trimethoprim This list may not describe all possible interactions. Give your health  care provider a list of all the medicines, herbs, non-prescription drugs, or dietary supplements you use. Also tell them if you smoke, drink alcohol, or use illegal drugs. Some items may interact with your medicine. What should I watch for while using this medicine? Visit your doctor or health care professional for regular checks on your progress. Check your blood pressure as directed. Ask your doctor what your blood pressure should be, and when you should contact them. You may need to be on a special diet while taking this medicine. Ask your doctor. Also, ask how many glasses of fluid you need to drink a day. You must not get dehydrated. This medicine may make you feel confused, dizzy or lightheaded. Drinking alcohol and taking some medicines can make this worse. Do not drive, use machinery, or do anything that needs mental alertness until you know how this medicine affects you. Do not sit or stand up quickly. What side effects may I notice from receiving this medicine? Side effects that you should report to your doctor or health care professional as soon as possible: -allergic reactions such as skin rash or itching, hives, swelling of the lips, mouth, tongue, or throat -black or tarry stools -fast, irregular heartbeat -fever -muscle pain, cramps -numbness, tingling in hands or feet -trouble breathing -trouble passing urine -unusual bleeding -unusually weak or tired Side effects that usually do not require medical attention (report to your doctor or health care professional if they continue or are bothersome): -change in voice or hair growth -confusion -dizzy, drowsy -dry mouth, increased thirst -enlarged or tender breasts -headache -irregular menstrual periods -sexual difficulty, unable to have an erection -stomach upset This list may not describe all possible side effects. Call your doctor for medical advice about side effects. You may report side effects to FDA at  1-800-FDA-1088. Where should I keep my medicine? Keep out of the reach of children. Store below 25 degrees C (77 degrees F). Throw away any unused medicine after the expiration date. NOTE: This sheet is a summary. It may not cover all possible information. If you have questions about this medicine, talk to your doctor, pharmacist, or health care provider.  2019 Elsevier/Gold Standard (2016-11-26 09:42:28)

## 2019-01-06 LAB — BASIC METABOLIC PANEL
BUN/Creatinine Ratio: 17 (ref 12–28)
BUN: 18 mg/dL (ref 8–27)
CO2: 21 mmol/L (ref 20–29)
Calcium: 9.5 mg/dL (ref 8.7–10.3)
Chloride: 104 mmol/L (ref 96–106)
Creatinine, Ser: 1.07 mg/dL — ABNORMAL HIGH (ref 0.57–1.00)
GFR calc Af Amer: 62 mL/min/{1.73_m2} (ref >59)
GFR calc non Af Amer: 53 mL/min/{1.73_m2} — ABNORMAL LOW (ref >59)
Glucose: 76 mg/dL (ref 65–99)
Potassium: 4.3 mmol/L (ref 3.5–5.2)
Sodium: 141 mmol/L (ref 134–144)

## 2019-01-06 LAB — PRO B NATRIURETIC PEPTIDE: NT-Pro BNP: 2302 pg/mL — ABNORMAL HIGH (ref 0–301)

## 2019-12-06 LAB — PULMONARY FUNCTION TEST

## 2020-04-22 LAB — PULMONARY FUNCTION TEST

## 2020-05-01 ENCOUNTER — Other Ambulatory Visit: Payer: Self-pay | Admitting: Orthopedic Surgery

## 2020-05-01 ENCOUNTER — Other Ambulatory Visit: Payer: Self-pay | Admitting: Student

## 2020-05-01 DIAGNOSIS — M25511 Pain in right shoulder: Secondary | ICD-10-CM

## 2020-05-12 ENCOUNTER — Ambulatory Visit
Admission: RE | Admit: 2020-05-12 | Discharge: 2020-05-12 | Disposition: A | Discharge status: Home / Self Care | Payer: Medicare Other | Source: Ambulatory Visit | Attending: Orthopedic Surgery | Admitting: Orthopedic Surgery

## 2020-05-12 ENCOUNTER — Other Ambulatory Visit: Payer: Self-pay

## 2020-05-12 DIAGNOSIS — M25511 Pain in right shoulder: Secondary | ICD-10-CM

## 2020-06-11 ENCOUNTER — Telehealth: Payer: Self-pay

## 2020-06-11 NOTE — Telephone Encounter (Signed)
   Primary Cardiologist: Shirlee More, MD  Chart reviewed as part of pre-operative protocol coverage. Because of [facility] M Burleson's past medical history and time since last visit, she will require a follow-up visit in order to better assess preoperative cardiovascular risk.  Pre-op covering staff: - Please schedule appointment and call patient to inform them. If patient already had an upcoming appointment within acceptable timeframe, please add "pre-op clearance" to the appointment notes so provider is aware. - Please contact requesting surgeon's office via preferred method (i.e, phone, fax) to inform them of need for appointment prior to surgery.  If applicable, this message will also be routed to pharmacy pool and/or primary cardiologist for input on holding anticoagulant/antiplatelet agent as requested below so that this information is available to the clearing provider at time of patient's appointment.   [place], [place]  06/11/2020, 4:59 PM

## 2020-06-11 NOTE — Telephone Encounter (Signed)
    [facility] Pre-operative Risk Assessment    Request for surgical clearance:  1. What type of surgery is being performed? Right Reverse Total Shoulder   2. When is this surgery scheduled? TBD   3. What type of clearance is required (medical clearance vs. Pharmacy clearance to hold med vs. Both)? Surgical  4. Are there any medications that need to be held prior to surgery and how long?None   5. Practice name and name of physician performing surgery? [facility]/Dr. Veverly Fells   6. What is your office phone number: N/A    7.   What is your office fax number:[phone]  8.   Anesthesia type (None, local, MAC, general) ? General   Yvonne Campbell 06/11/2020, 4:16 PM  _________________________________________________________________   (provider comments below)

## 2020-06-11 NOTE — Telephone Encounter (Signed)
Left message for the pt to call back to schedule a pre op appt with Dr. Bettina Gavia oer APP.

## 2020-06-13 ENCOUNTER — Telehealth: Payer: Self-pay | Admitting: Cardiology

## 2020-06-13 NOTE — Telephone Encounter (Signed)
LVM for pt to CB to schedule pre op clearance  appt/kbl

## 2020-06-16 NOTE — Telephone Encounter (Signed)
Yvonne Campbell, CMA I called patient to schedule. She did not answer/left message for her to call office to schedule preop appt.

## 2020-06-16 NOTE — Telephone Encounter (Signed)
Message sent to Dr. Arloa Koh office to please assist in making pre op appt.

## 2020-06-16 NOTE — Telephone Encounter (Signed)
Patient called to return Johnna's call, connected call to [facility].

## 2020-06-17 ENCOUNTER — Telehealth: Payer: Self-pay | Admitting: Cardiology

## 2020-06-17 NOTE — Telephone Encounter (Signed)
See previous encounter

## 2020-06-17 NOTE — Telephone Encounter (Signed)
Patient called and said her arm surgery has been scheduled for Monday 06/23/20. She wanted to know if Dr. Bettina Gavia would clear her for her surgery sooner. Please advise

## 2020-06-17 NOTE — Telephone Encounter (Signed)
After receiving the pts message I reviewed Dr. Joya Gaskins schedule but cannot locate an appt [facility] or [place] prior to her surgery 06/23/20 for her clearance.. will forward to the [facility] and his nurse for review.

## 2020-06-25 DIAGNOSIS — F1990 Other psychoactive substance use, unspecified, uncomplicated: Secondary | ICD-10-CM | POA: Insufficient documentation

## 2020-06-25 DIAGNOSIS — J209 Acute bronchitis, unspecified: Secondary | ICD-10-CM | POA: Insufficient documentation

## 2020-06-25 DIAGNOSIS — E785 Hyperlipidemia, unspecified: Secondary | ICD-10-CM | POA: Insufficient documentation

## 2020-06-25 DIAGNOSIS — E119 Type 2 diabetes mellitus without complications: Secondary | ICD-10-CM | POA: Insufficient documentation

## 2020-06-25 DIAGNOSIS — Z780 Asymptomatic menopausal state: Secondary | ICD-10-CM | POA: Insufficient documentation

## 2020-06-25 DIAGNOSIS — R7302 Impaired glucose tolerance (oral): Secondary | ICD-10-CM | POA: Insufficient documentation

## 2020-06-25 DIAGNOSIS — I517 Cardiomegaly: Secondary | ICD-10-CM | POA: Insufficient documentation

## 2020-06-25 DIAGNOSIS — I10 Essential (primary) hypertension: Secondary | ICD-10-CM | POA: Insufficient documentation

## 2020-07-01 NOTE — Progress Notes (Signed)
Cardiology Office Note:    Date:  07/02/2020   ID:  Yvonne Campbell, Yvonne Campbell [birth year], MRN [id number]  PCP:  Franchot Mimes, MD  Cardiologist:  Shirlee More, MD    Referring MD: Franchot Mimes, MD    ASSESSMENT:    1. Preoperative cardiovascular examination   2. Hypertensive heart disease with heart failure ([place])   3. Hypertension, unspecified type   4. Severe mitral regurgitation    PLAN:    In order of problems listed above:  1. Despite the severity of her heart disease she is well compensated optimized for planned surgical procedure 2. Good response to guideline directed therapy continue MRA Entresto beta-blocker I would not uptitrate with a heart rate of 60 she is no longer on a loop diuretic and its critical get the results of her echocardiogram and she may require consideration of either mitral clip if regurgitation remains severe and optimal therapy and/or ICD. I plan to see her back in 6 weeks to be able to pull things together   Next appointment: 6 weeks with me   Medication Adjustments/Labs and Tests Ordered: Current medicines are reviewed at length with the patient today.  Concerns regarding medicines are outlined above.  Orders Placed This Encounter  Procedures  . EKG 12-Lead   No orders of the defined types were placed in this encounter.   Chief Complaint  Patient presents with  . Follow-up  . Congestive Heart Failure  . Pre-op Exam    History of Present Illness:    Yvonne Campbell is a 69 y.o. 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 Echocardiogram 07/25/2018 EF 25%, severe LV dysfunction, severe MR   Compliance with diet, lifestyle and medications: Yes  She  is being scheduled for total shoulder arthroplasty. Request to see me for preoperative evaluation. She is done exceptionally well with guideline directed therapy for cardiomyopathy and heart failure not on a loop diuretic and she is New York Heart Association class I exercise tolerance exceeds 7 METS. She thinks she had an echocardiogram performed as an outpatient [place] October.  Preoperative cardiovascular evaluation summary  Surgeon: Dr. Wiliam Ke Procedure: Shoulder surgery, the patient says she is having a total shoulder arthroplasty The surgery is elective Active cardiac problems severe dilated cardiomyopathy and heart failure.  Revised cardiac risk score: 1, low risk spite her cardiomyopathy The cardiac status is stable New York Heart Association class I well preserved exercise tolerance 7 METS or greater. The planned procedure is intermediate risk. The functional capacity is 4 mets or greater yes Recent cardiac tests performed she had an echocardiogram in October and requesting the results Given the above his overall risk for the planned procedure is acceptable, her heart failure is compensated and she is optimized for planned surgical procedure Antiplatelet/ anticoagulant recommendation: None, she can safely withdraw aspirin perioperatively Other cardiac medication or device recommendation: Continue her usual cardiac medications perioperatively Anesthesia recommendation: None Observation, monitoring,and postoperative test recommendation: She tells me she will be in the hospital for 48 hours The patient is optimized from a cardiology perspective: Yes      Past Medical History:  Diagnosis Date  . Acute bronchitis   . Diabetes mellitus without complication ([place])   . Drug  use   . Epistaxis 11/22/2018  . Hyperlipidemia   . Hypertension   . Hypertensive heart disease with heart failure ([place]) 08/29/2018   Essential hypertension  . Impaired glucose tolerance   . Left ventricular  dysfunction 08/29/2018   Left ventricular dysfunction  . LVH (left ventricular hypertrophy)   . Menopause   . Pneumonia of right lower lobe due to infectious organism 10/23/2018  . 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  . Primary osteoarthritis of right hip 05/01/2018  . Severe mitral regurgitation 08/29/2018   Mitral regurgitation  . Tobacco abuse 08/29/2018   Tobacco abuse    Past Surgical History:  Procedure Laterality Date  . CARDIAC CATHETERIZATION    . CESAREAN SECTION    . CHOLECYSTECTOMY    . RIGHT/LEFT HEART CATH AND CORONARY ANGIOGRAPHY N/A 09/04/2018   Procedure: RIGHT/LEFT HEART CATH AND CORONARY ANGIOGRAPHY;  Surgeon: Lorretta Harp, MD;  Location: [facility];  Service: Cardiovascular;  Laterality: N/A;    Current Medications: Current Meds  Medication Sig  . albuterol (VENTOLIN HFA) 108 (90 Base) MCG/ACT inhaler Inhale 1-2 puffs into the lungs every 4 (four) hours as needed.  Marland Kitchen aspirin EC 81 MG tablet Take 1 tablet by mouth daily.  . carvedilol (COREG) 12.5 MG tablet Take 12.5 mg by mouth 2 (two) times daily.  . cetirizine (ZYRTEC) 10 MG tablet Take 10 mg by mouth daily.  . cyclobenzaprine (FLEXERIL) 10 MG tablet Take 10 mg by mouth 3 (three) times daily as needed for muscle spasms.   Marland Kitchen ENTRESTO 97-103 MG Take 1 tablet by mouth 2 (two) times a day.  . hydrOXYzine (ATARAX/VISTARIL) 25 MG tablet Take 1 tablet by mouth at bedtime.  Marland Kitchen omeprazole (PRILOSEC) 20 MG capsule Take 20 mg by mouth daily as needed.   Marland Kitchen PROAIR HFA 108 (90 Base) MCG/ACT inhaler 2 puffs every 6 (six) hours as needed.   Marland Kitchen SINGULAIR 10 MG tablet Take 1 tablet by mouth daily.  . sodium chloride (ALTAMIST SPRAY) 0.65 % nasal spray Place 2 sprays into the nose as needed.      Allergies:   Antiseborrheic products, misc.; Latex; Penicillins; and Sulfa antibiotics   Social History   Socioeconomic History  . Marital status: Single    Spouse name: Not on file  . Number of children: Not  on file  . Years of education: Not on file  . Highest education level: Not on file  Occupational History  . Not on file  Tobacco Use  . Smoking status: Former Smoker    Packs/day: 0.50    Years: 35.00    Pack years: 17.50    Types: Cigarettes    Quit date: 10/2018    Years since quitting: 1.6  . Smokeless tobacco: Never Used  Vaping Use  . Vaping Use: Never used  Substance and Sexual Activity  . Alcohol use: Not Currently  . Drug use: Yes    Types: Marijuana  . Sexual activity: Not on file  Other Topics Concern  . Not on file  Social History Narrative  . Not on file   Social Determinants of Health   Financial Resource Strain:   . Difficulty of Paying Living Expenses: Not on file  Food Insecurity:   . Worried About Charity fundraiser in the Last Year: Not on file  . Ran Out of Food in the Last Year: Not on file  Transportation Needs:   . Lack of Transportation (Medical): Not on file  .  Lack of Transportation (Non-Medical): Not on file  Physical Activity:   . Days of Exercise per Week: Not on file  . Minutes of Exercise per Session: Not on file  Stress:   . Feeling of Stress : Not on file  Social Connections:   . Frequency of Communication with Friends and Family: Not on file  . Frequency of Social Gatherings with Friends and Family: Not on file  . Attends Religious Services: Not on file  . Active Member of Clubs or Organizations: Not on file  . Attends Archivist Meetings: Not on file  . Marital Status: Not on file     Family History: The patient's family history includes Breast cancer in her mother; Heart attack in her paternal grandfather; Hypertension in her mother and paternal grandfather. ROS:   Please see the history of present illness.    All other systems reviewed and are negative.  EKGs/Labs/Other Studies Reviewed:    The following studies were reviewed today:  EKG:  EKG ordered today and personally reviewed.  The ekg ordered today  demonstrates sinus rhythm left axis deviation LVH and repolarization change  Recent Labs: She has had no recent labs performed that I can access in the medical record  Physical Exam:    VS:  BP 136/80 (BP Location: Right Arm, Patient Position: Sitting, Cuff Size: Normal)   Pulse 60   Ht 5\' 4"  (1.626 m)   Wt 137 lb (62.1 kg)   SpO2 94%   BMI 23.52 kg/m     Wt Readings from Last 3 Encounters:  07/02/20 137 lb (62.1 kg)  01/05/19 129 lb 6.4 oz (58.7 kg)  09/04/18 129 lb (58.5 kg)     GEN: Small stature well nourished, well developed in no acute distress HEENT: Normal NECK: No JVD; No carotid bruits LYMPHATICS: No lymphadenopathy CARDIAC: Soft S1 no S3 RRR, no murmurs, rubs, gallops RESPIRATORY:  Clear to auscultation without rales, wheezing or rhonchi  ABDOMEN: Soft, non-tender, non-distended MUSCULOSKELETAL:  No edema; No deformity  SKIN: Warm and dry NEUROLOGIC:  Alert and oriented x 3 PSYCHIATRIC:  Normal affect    Signed, Shirlee More, MD  07/02/2020 11:28 AM    [facility]

## 2020-07-02 ENCOUNTER — Other Ambulatory Visit: Payer: Self-pay

## 2020-07-02 ENCOUNTER — Encounter: Payer: Self-pay | Admitting: Cardiology

## 2020-07-02 ENCOUNTER — Ambulatory Visit (INDEPENDENT_AMBULATORY_CARE_PROVIDER_SITE_OTHER): Payer: Medicare Other | Admitting: Cardiology

## 2020-07-02 VITALS — BP 136/80 | HR 60 | Ht 64.0 in | Wt 137.0 lb

## 2020-07-02 DIAGNOSIS — I10 Essential (primary) hypertension: Secondary | ICD-10-CM | POA: Diagnosis not present

## 2020-07-02 DIAGNOSIS — Z01810 Encounter for preprocedural cardiovascular examination: Secondary | ICD-10-CM | POA: Diagnosis not present

## 2020-07-02 DIAGNOSIS — I110 Hypertensive heart disease with heart failure: Secondary | ICD-10-CM

## 2020-07-02 DIAGNOSIS — I340 Nonrheumatic mitral (valve) insufficiency: Secondary | ICD-10-CM | POA: Diagnosis not present

## 2020-07-02 NOTE — Patient Instructions (Signed)
Medication Instructions:  Your physician recommends that you continue on your current medications as directed. Please refer to the Current Medication list given to you today.  *If you need a refill on your cardiac medications before your next appointment, please call your pharmacy*   Lab Work: None If you have labs (blood work) drawn today and your tests are completely normal, you will receive your results only by: MyChart Message (if you have MyChart) OR A paper copy in the mail If you have any lab test that is abnormal or we need to change your treatment, we will call you to review the results.   Testing/Procedures: None   Follow-Up: At [facility], you and your health needs are our priority.  As part of our continuing mission to provide you with exceptional heart care, we have created designated Provider Care Teams.  These Care Teams include your primary Cardiologist (physician) and Advanced Practice Providers (APPs -  Physician Assistants and Nurse Practitioners) who all work together to provide you with the care you need, when you need it.  We recommend signing up for the patient portal called "MyChart".  Sign up information is provided on this After Visit Summary.  MyChart is used to connect with patients for Virtual Visits (Telemedicine).  Patients are able to view lab/test results, encounter notes, upcoming appointments, etc.  Non-urgent messages can be sent to your provider as well.   To learn more about what you can do with MyChart, go to [email or web address].    Your next appointment:   6 week(s)  The format for your next appointment:   In Person  Provider:   Brian Munley, MD    Other Instructions   

## 2020-07-23 ENCOUNTER — Telehealth: Payer: Self-pay

## 2020-07-23 NOTE — Telephone Encounter (Signed)
NOTES ON FILE FROM [facility] MEDICAL FOR SURGICAL CLEARANCE [phone] [phone], GAVE NOTES TO JENNIFER

## 2020-08-01 ENCOUNTER — Telehealth: Payer: Self-pay | Admitting: *Deleted

## 2020-08-01 NOTE — Telephone Encounter (Signed)
Our office received notes from [facility] for Referral to Dr. Rayann Heman for surgery clearance. Pt was recently seen by Dr. Bettina Gavia who is pt's primary cardiologist. Please see Dr. Joya Gaskins ov note from 07/02/20. Dr. Loney Hering states: pt may require consideration of either mitral clip if regurgitation remains severe and optimal therapy and/or ICD. I will see her back in 6 weeks to be able to pull things together.    Pt has appt with Dr. Loney Hering 08/19/20 to discuss plan further. After speaking with EP [facility], who felt that we should wait on making appt with Dr. Rayann Heman until pt follows up with Dr. Loney Hering 08/19/20 for more definite plan of care. I am going to fax notes back to [facility] along with Dr. Joya Gaskins office note.

## 2020-08-18 NOTE — Progress Notes (Unsigned)
Cardiology Office Note:    Date:  08/19/2020   ID:  Yvonne Campbell, Yvonne Campbell 08-05-1950, MRN [id number]  PCP:  Franchot Mimes, MD  Cardiologist:  Shirlee More, MD    Referring MD: Franchot Mimes, MD    ASSESSMENT:    1. Hypertensive heart disease with heart failure ([place])   2. Severe mitral regurgitation   3. Nonischemic cardiomyopathy ([place])    PLAN:    In order of problems listed above:  1. She is improved New [place] Heart Association class I with a marked increase in left ventricular ejection fraction and resolution of her functional severe mitral regurgitation.  She will remain on current medical therapy including carvedilol high-dose Entresto at this time does not require loop diuretic.  She said she is going to follow-up from this point time with Dr. Orlena Sheldon medical cardiology. 2. Resolved functional improved with guideline directed therapy for heart failure 3. Marked improvement ejection fraction most recently 45 to 50% continue current guideline directed treatment   Next appointment: As needed   Medication Adjustments/Labs and Tests Ordered: Current medicines are reviewed at length with the patient today.  Concerns regarding medicines are outlined above.  No orders of the defined types were placed in this encounter.  No orders of the defined types were placed in this encounter.   Chief complaint follow-up for heart failure review echocardiogram  History of Present Illness:    Yvonne Campbell is a 70 y.o. female with a hx of hypertensive heart disease with heart failure and functional mitral regurgitation noted to be severe in the setting of severely reduced ejection fraction in 2019 last seen 07/02/2020 echocardiogram performed as try to get hold of the results to see whether she needed be considered for mitral clip.  Heart failure is best described as systolic with improved ejection fraction now mid range 45 to 50%.  The records and there is no history of allergy  to ACE or ARB  Guideline Directed therapy with MRA Entresto and beta-blocker..  Compliance with diet, lifestyle and medications: Yes  She is no longer on spironolactone She has noticed breast enlargement?  Related to her MRI. She feels well no shortness of breath edema chest pain palpitations syncope and compliant with her medications. She remains on beta-blocker carvedilol high-dose Entresto and does not require loop diuretic.  Recent labs 07/11/2020: Cholesterol 188 LDL 88 HDL 53 triglycerides 236 Potassium 4.2 creatinine 1.1 GFR 58 cc/min hemoglobin 12.3 she had an EKG same day read as sinus rhythm conduction delay left axis deviation nonspecific ST and T changes LVH or repolarization  Echocardiogram performed at [facility] 04/11/2020 shows mild concentric LVH EF 45 to 50% mild left atrial enlargement and no significant valvular heart disease.  Mitral regurgitation was trace. Echocardiogram performed at [facility] there were 10/12/2019 shows EF 50 to 55% mild concentric LVH. Carotid duplex performed at [facility] 08/21/2019 showed no significant hemodynamic stenosis in the carotid artery bilateral. Past Medical History:  Diagnosis Date  . Acute bronchitis   . Diabetes mellitus without complication ([place])   . Drug use   . Epistaxis 11/22/2018  . Hyperlipidemia   . Hypertension   . Hypertensive heart disease with heart failure ([place]) 08/29/2018   Essential hypertension  . Impaired glucose tolerance   . Left ventricular dysfunction 08/29/2018   Left ventricular dysfunction  . LVH (left ventricular hypertrophy)   . Menopause   . Pneumonia of right lower lobe due to infectious organism 10/23/2018  . Primary osteoarthritis  of left shoulder 04/24/2018  . Primary osteoarthritis of right hip 05/01/2018  . Severe mitral regurgitation 08/29/2018   Mitral regurgitation  . Tobacco abuse 08/29/2018   Tobacco abuse    Past Surgical History:  Procedure Laterality Date  . CARDIAC CATHETERIZATION    . CESAREAN  SECTION    . CHOLECYSTECTOMY    . RIGHT/LEFT HEART CATH AND CORONARY ANGIOGRAPHY N/A 09/04/2018   Procedure: RIGHT/LEFT HEART CATH AND CORONARY ANGIOGRAPHY;  Surgeon: Lorretta Harp, MD;  Location: [place] [facility];  Service: Cardiovascular;  Laterality: N/A;    Current Medications: Current Meds  Medication Sig  . albuterol (VENTOLIN HFA) 108 (90 Base) MCG/ACT inhaler Inhale 1-2 puffs into the lungs every 4 (four) hours as needed.  Marland Kitchen aspirin EC 81 MG tablet Take 1 tablet by mouth daily.  . carvedilol (COREG) 12.5 MG tablet Take 12.5 mg by mouth 2 (two) times daily.  . cetirizine (ZYRTEC) 10 MG tablet Take 10 mg by mouth daily.  . cyclobenzaprine (FLEXERIL) 10 MG tablet Take 10 mg by mouth 3 (three) times daily as needed for muscle spasms.   Marland Kitchen ENTRESTO 97-103 MG Take 1 tablet by mouth 2 (two) times a day.  . gabapentin (NEURONTIN) 100 MG capsule Take 200 mg by mouth 2 (two) times daily.  . hydrOXYzine (ATARAX/VISTARIL) 25 MG tablet Take 1 tablet by mouth at bedtime.  Marland Kitchen omeprazole (PRILOSEC) 20 MG capsule Take 20 mg by mouth daily as needed.   Marland Kitchen PROAIR HFA 108 (90 Base) MCG/ACT inhaler 2 puffs every 6 (six) hours as needed.   Marland Kitchen SINGULAIR 10 MG tablet Take 1 tablet by mouth daily.  . sodium chloride (OCEAN) 0.65 % nasal spray Place 2 sprays into the nose as needed.   . venlafaxine (EFFEXOR) 75 MG tablet Take 75 mg by mouth daily.     Allergies:   Antiseborrheic products, misc.; Latex; Penicillins; and Sulfa antibiotics   Social History   Socioeconomic History  . Marital status: Single    Spouse name: Not on file  . Number of children: Not on file  . Years of education: Not on file  . Highest education level: Not on file  Occupational History  . Not on file  Tobacco Use  . Smoking status: Former Smoker    Packs/day: 0.50    Years: 35.00    Pack years: 17.50    Types: Cigarettes    Quit date: 10/2018    Years since quitting: 1.8  . Smokeless tobacco: Never Used  Vaping Use   . Vaping Use: Never used  Substance and Sexual Activity  . Alcohol use: Not Currently  . Drug use: Yes    Types: Marijuana  . Sexual activity: Not on file  Other Topics Concern  . Not on file  Social History Narrative  . Not on file   Social Determinants of Health   Financial Resource Strain: Not on file  Food Insecurity: Not on file  Transportation Needs: Not on file  Physical Activity: Not on file  Stress: Not on file  Social Connections: Not on file     Family History: The patient's family history includes Breast cancer in her mother; Heart attack in her paternal grandfather; Hypertension in her mother and paternal grandfather. ROS:   Please see the history of present illness.    All other systems reviewed and are negative.  EKGs/Labs/Other Studies Reviewed:    The following studies were reviewed today:  Physical Exam:    VS:  BP 138/82   Pulse 70   Ht 5\' 4"  (1.626 m)   Wt 128 lb 6.4 oz (58.2 kg)   SpO2 96%   BMI 22.04 kg/m     Wt Readings from Last 3 Encounters:  08/19/20 128 lb 6.4 oz (58.2 kg)  07/02/20 137 lb (62.1 kg)  01/05/19 129 lb 6.4 oz (58.7 kg)     GEN:  Well nourished, well developed in no acute distress HEENT: Normal NECK: No JVD; No carotid bruits LYMPHATICS: No lymphadenopathy CARDIAC: RRR, no murmurs, rubs, gallops RESPIRATORY:  Clear to auscultation without rales, wheezing or rhonchi  ABDOMEN: Soft, non-tender, non-distended MUSCULOSKELETAL:  No edema; No deformity  SKIN: Warm and dry NEUROLOGIC:  Alert and oriented x 3 PSYCHIATRIC:  Normal affect    Signed, Shirlee More, MD  08/19/2020 4:20 PM    [facility]

## 2020-08-19 ENCOUNTER — Other Ambulatory Visit: Payer: Self-pay

## 2020-08-19 ENCOUNTER — Ambulatory Visit (INDEPENDENT_AMBULATORY_CARE_PROVIDER_SITE_OTHER): Payer: Medicare Other | Admitting: Cardiology

## 2020-08-19 ENCOUNTER — Encounter: Payer: Self-pay | Admitting: Cardiology

## 2020-08-19 VITALS — BP 138/82 | HR 70 | Ht 64.0 in | Wt 128.4 lb

## 2020-08-19 DIAGNOSIS — I340 Nonrheumatic mitral (valve) insufficiency: Secondary | ICD-10-CM

## 2020-08-19 DIAGNOSIS — I110 Hypertensive heart disease with heart failure: Secondary | ICD-10-CM

## 2020-08-19 DIAGNOSIS — I428 Other cardiomyopathies: Secondary | ICD-10-CM

## 2020-08-19 NOTE — Patient Instructions (Signed)

## 2020-09-01 NOTE — Patient Instructions (Signed)
DUE TO COVID-19 ONLY ONE VISITOR IS ALLOWED TO COME WITH YOU AND STAY IN THE WAITING ROOM ONLY DURING PRE OP AND PROCEDURE DAY OF SURGERY. THE 1 VISITOR  MAY VISIT WITH YOU AFTER SURGERY IN YOUR PRIVATE ROOM DURING VISITING HOURS ONLY!  YOU NEED TO HAVE A COVID 19 TEST ON: 09/02/20 @ 11:35 AM, THIS TEST MUST BE DONE BEFORE SURGERY,  COVID TESTING SITE [place] [id number], IT IS ON THE RIGHT GOING OUT [address] APPROXIMATELY  2 MINUTES PAST [employer] ON THE RIGHT. ONCE YOUR COVID TEST IS COMPLETED,  PLEASE BEGIN THE QUARANTINE INSTRUCTIONS AS OUTLINED IN YOUR HANDOUT.                [place]    Your procedure is scheduled on: 09/05/20   Report to [facility] Main  Entrance   Report to short stay at: 5:30 AM     Call this number if you have problems the morning of surgery [phone]    Remember: Do not eat food or drink liquids :After Midnight.   BRUSH YOUR TEETH MORNING OF SURGERY AND RINSE YOUR MOUTH OUT, NO CHEWING GUM CANDY OR MINTS.    Take these medicines the morning of surgery with A SIP OF WATER: carvedilol,cetirizine,gabapentin,omeprazole,venlafaxine.                               You may not have any metal on your body including hair pins and              piercings  Do not wear jewelry, make-up, lotions, powders or perfumes, deodorant             Do not wear nail polish on your fingernails.  Do not shave  48 hours prior to surgery.    Do not bring valuables to the hospital. [place].  Contacts, dentures or bridgework may not be worn into surgery.  Leave suitcase in the car. After surgery it may be brought to your room.     Patients discharged the day of surgery will not be allowed to drive home. IF YOU ARE HAVING SURGERY AND GOING HOME THE SAME DAY, YOU MUST HAVE AN ADULT TO DRIVE YOU HOME AND BE WITH YOU FOR 24 HOURS. YOU MAY GO HOME BY TAXI OR UBER OR ORTHERWISE, BUT AN  ADULT MUST ACCOMPANY YOU HOME AND STAY WITH YOU FOR 24 HOURS.  Name and phone number of your driver:  Special Instructions: N/A              Please read over the following fact sheets you were given: _____________________________________________________________________        [facility] - Preparing for Surgery Before surgery, you can play an important role.  Because skin is not sterile, your skin needs to be as free of germs as possible.  You can reduce the number of germs on your skin by washing with CHG (chlorahexidine gluconate) soap before surgery.  CHG is an antiseptic cleaner which kills germs and bonds with the skin to continue killing germs even after washing. Please DO NOT use if you have an allergy to CHG or antibacterial soaps.  If your skin becomes reddened/irritated stop using the CHG and inform your nurse when you arrive at Short Stay. Do not shave (  including legs and underarms) for at least 48 hours prior to the first CHG shower.  You may shave your face/neck. Please follow these instructions carefully:  1.  Shower with CHG Soap the night before surgery and the  morning of Surgery.  2.  If you choose to wash your hair, wash your hair first as usual with your  normal  shampoo.  3.  After you shampoo, rinse your hair and body thoroughly to remove the  shampoo.                           4.  Use CHG as you would any other liquid soap.  You can apply chg directly  to the skin and wash                       Gently with a scrungie or clean washcloth.  5.  Apply the CHG Soap to your body ONLY FROM THE NECK DOWN.   Do not use on face/ open                           Wound or open sores. Avoid contact with eyes, ears mouth and genitals (private parts).                       Wash face,  Genitals (private parts) with your normal soap.             6.  Wash thoroughly, paying special attention to the area where your surgery  will be performed.  7.  Thoroughly rinse your body with warm water from  the neck down.  8.  DO NOT shower/wash with your normal soap after using and rinsing off  the CHG Soap.                9.  Pat yourself dry with a clean towel.            10.  Wear clean pajamas.            11.  Place clean sheets on your bed the night of your first shower and do not  sleep with pets. Day of Surgery : Do not apply any lotions/deodorants the morning of surgery.  Please wear clean clothes to the hospital/surgery center.  FAILURE TO FOLLOW THESE INSTRUCTIONS MAY RESULT IN THE CANCELLATION OF YOUR SURGERY PATIENT SIGNATURE_________________________________  NURSE SIGNATURE__________________________________  ________________________________________________________________________ [facility]- Preparing for Total Shoulder Arthroplasty    Before surgery, you can play an important role. Because skin is not sterile, your skin needs to be as free of germs as possible. You can reduce the number of germs on your skin by using the following products. . Benzoyl Peroxide Gel o Reduces the number of germs present on the skin o Applied twice a day to shoulder area starting two days before surgery    ==================================================================  Please follow these instructions carefully:  BENZOYL PEROXIDE 5% GEL  Please do not use if you have an allergy to benzoyl peroxide.   If your skin becomes reddened/irritated stop using the benzoyl peroxide.  Starting two days before surgery, apply as follows: 1. Apply benzoyl peroxide in the morning and at night. Apply after taking a shower. If you are not taking a shower clean entire shoulder front, back, and side along with the armpit with a clean wet washcloth.  2. Place a  quarter-sized dollop on your shoulder and rub in thoroughly, making sure to cover the front, back, and side of your shoulder, along with the armpit.   2 days before ____ AM   ____ PM              1 day before ____ AM   ____ PM                          3. Do this twice a day for two days.  (Last application is the night before surgery, AFTER using the CHG soap as described below).  4. Do NOT apply benzoyl peroxide gel on the day of surgery.

## 2020-09-01 NOTE — Progress Notes (Signed)
Pt. Needs orders for upcomming surgery. PAT and labs on 09/02/20.Thanks.

## 2020-09-02 ENCOUNTER — Encounter (HOSPITAL_COMMUNITY): Payer: Self-pay

## 2020-09-02 ENCOUNTER — Other Ambulatory Visit (HOSPITAL_COMMUNITY)
Admission: RE | Admit: 2020-09-02 | Discharge: 2020-09-02 | Disposition: A | Discharge status: Home / Self Care | Payer: Medicare HMO | Source: Ambulatory Visit | Attending: Orthopedic Surgery | Admitting: Orthopedic Surgery

## 2020-09-02 ENCOUNTER — Other Ambulatory Visit: Payer: Self-pay

## 2020-09-02 ENCOUNTER — Encounter (HOSPITAL_COMMUNITY)
Admission: RE | Admit: 2020-09-02 | Discharge: 2020-09-02 | Disposition: A | Discharge status: Home / Self Care | Payer: Medicare HMO | Source: Ambulatory Visit | Attending: Orthopedic Surgery | Admitting: Orthopedic Surgery

## 2020-09-02 DIAGNOSIS — Z20822 Contact with and (suspected) exposure to covid-19: Secondary | ICD-10-CM | POA: Insufficient documentation

## 2020-09-02 DIAGNOSIS — Z01812 Encounter for preprocedural laboratory examination: Secondary | ICD-10-CM | POA: Diagnosis not present

## 2020-09-02 HISTORY — DX: Leukemia, unspecified not having achieved remission: C95.90

## 2020-09-02 HISTORY — DX: Anemia, unspecified: D64.9

## 2020-09-02 HISTORY — DX: Heart failure, unspecified: I50.9

## 2020-09-02 LAB — BASIC METABOLIC PANEL
Anion gap: 10 (ref 5–15)
BUN: 10 mg/dL (ref 8–23)
CO2: 26 mmol/L (ref 22–32)
Calcium: 9 mg/dL (ref 8.9–10.3)
Chloride: 107 mmol/L (ref 98–111)
Creatinine, Ser: 1.02 mg/dL — ABNORMAL HIGH (ref 0.44–1.00)
GFR, Estimated: 59 mL/min — ABNORMAL LOW (ref >60)
Glucose, Bld: 117 mg/dL — ABNORMAL HIGH (ref 70–99)
Potassium: 3.4 mmol/L — ABNORMAL LOW (ref 3.5–5.1)
Sodium: 143 mmol/L (ref 135–145)

## 2020-09-02 LAB — CBC
HCT: 41.1 % (ref 36.0–46.0)
Hemoglobin: 13.3 g/dL (ref 12.0–15.0)
MCH: 29.8 pg (ref 26.0–34.0)
MCHC: 32.4 g/dL (ref 30.0–36.0)
MCV: 92.2 fL (ref 80.0–100.0)
Platelets: 267 10*3/uL (ref 150–400)
RBC: 4.46 MIL/uL (ref 3.87–5.11)
RDW: 13 % (ref 11.5–15.5)
WBC: 5.9 10*3/uL (ref 4.0–10.5)
nRBC: 0 % (ref 0.0–0.2)

## 2020-09-02 LAB — HEMOGLOBIN A1C
Hgb A1c MFr Bld: 5.9 % — ABNORMAL HIGH (ref 4.8–5.6)
Mean Plasma Glucose: 122.63 mg/dL

## 2020-09-02 LAB — GLUCOSE, CAPILLARY: Glucose-Capillary: 117 mg/dL — ABNORMAL HIGH (ref 70–99)

## 2020-09-02 LAB — SARS CORONAVIRUS 2 (TAT 6-24 HRS): SARS Coronavirus 2: NEGATIVE

## 2020-09-02 LAB — SURGICAL PCR SCREEN
MRSA, PCR: NEGATIVE
Staphylococcus aureus: NEGATIVE

## 2020-09-02 NOTE — Progress Notes (Signed)
COVID Vaccine Completed: Yes Date COVID Vaccine completed: 07/2020 [place] COVID vaccine manufacturer:     Moderna     PCP - Windell Hummingbird PA-C Cardiologist - Dr. Shirlee More. LOV: 08/19/20  Chest x-ray -  EKG - 07/02/20 Stress Test -  ECHO -  Cardiac Cath - 09/04/18 Pacemaker/ICD device last checked:  Sleep Study -  CPAP -   Fasting Blood Sugar - N/A Checks Blood Sugar ___0__ times a day  Blood Thinner Instructions: Aspirin Instructions: Last Dose:  Anesthesia review: Hx: HTN,mitral valve regurgitation,HF,DIA  Patient denies shortness of breath, fever, cough and chest pain at PAT appointment   Patient verbalized understanding of instructions that were given to them at the PAT appointment. Patient was also instructed that they will need to review over the PAT instructions again at home before surgery.

## 2020-09-03 NOTE — Anesthesia Preprocedure Evaluation (Signed)
Anesthesia Evaluation  Patient identified by MRN, date of birth, ID band Patient awake    Reviewed: Allergy & Precautions, NPO status , Patient's Chart, lab work & pertinent test results, reviewed documented beta blocker date and time   Airway Mallampati: II  TM Distance: >3 FB Neck ROM: Full    Dental  (+) Dental Advisory Given, Edentulous Upper, Edentulous Lower   Pulmonary Patient abstained from smoking., former smoker,    Pulmonary exam normal breath sounds clear to auscultation       Cardiovascular hypertension, Pt. on home beta blockers and Pt. on medications Normal cardiovascular exam+ Valvular Problems/Murmurs MR  Rhythm:Regular Rate:Normal  Echocardiogram performed at [facility] 04/11/2020 shows mild concentric LVH EF 45 to 50% mild left atrial enlargement and no significant valvular heart disease.  Mitral regurgitation was trace.   Neuro/Psych negative neurological ROS     GI/Hepatic Neg liver ROS, GERD  Medicated,  Endo/Other  negative endocrine ROSdiabetes, Type 2  Renal/GU negative Renal ROS     Musculoskeletal  (+) Arthritis , right shoulder osteoarthritis   Abdominal   Peds  Hematology negative hematology ROS (+)   Anesthesia Other Findings   Reproductive/Obstetrics                           Anesthesia Physical Anesthesia Plan  ASA: III  Anesthesia Plan: General   Post-op Pain Management:  Regional for Post-op pain   Induction: Intravenous  PONV Risk Score and Plan: 3 and Dexamethasone, Ondansetron, Treatment may vary due to age or medical condition and Midazolam  Airway Management Planned: Oral ETT  Additional Equipment:   Intra-op Plan:   Post-operative Plan: Extubation in OR  Informed Consent: I have reviewed the patients History and Physical, chart, labs and discussed the procedure including the risks, benefits and alternatives for the proposed anesthesia with the  patient or authorized representative who has indicated his/her understanding and acceptance.     Dental advisory given  Plan Discussed with: CRNA  Anesthesia Plan Comments: (See PAT note 09/02/2020, Konrad Felix, PA-C)      Anesthesia Quick Evaluation

## 2020-09-03 NOTE — Progress Notes (Signed)
Anesthesia Chart Review   Case: [id number] Date/Time: 09/05/20 0715   Procedure: REVERSE SHOULDER ARTHROPLASTY (Right Shoulder) - with interscalene block   Anesthesia type: General   Pre-op diagnosis: right shoulder osteoarthritis   Location: [facility] ROOM 06 / [facility]   Surgeons: Netta Cedars, MD      DISCUSSION:70 y.o. former smoker with h/o HTN, DM II, nonischemic cardiomyopathy, right shoulder OA scheduled for above procedure 09/05/20 with Dr. Netta Cedars.   Echocardiogram performed at [facility] 04/11/2020 shows mild concentric LVH EF 45 to 50% mild left atrial enlargement and no significant valvular heart disease.  Mitral regurgitation was trace.  Pt seen by cardiology 07/02/2020 for preoperative evaluation.  Per OV note, "Active cardiac problems severe dilated cardiomyopathy and heart failure. Revised cardiac risk score: 1, low risk despite her cardiomyopathy The cardiac status is stable [place] Heart Association class I well preserved exercise tolerance 7 METS or greater. The planned procedure is intermediate risk. The functional capacity is 4 mets or greater yes Recent cardiac tests performed she had an echocardiogram in October and requesting the results Given the above his overall risk for the planned procedure is acceptable, her heart failure is compensated and she is optimized for planned surgical procedure"  Anticipate pt can proceed with planned procedure barring acute status change.   VS: BP (!) 153/96   Pulse 88   Temp 37.2 C (Oral)   Ht 5\' 4"  (1.626 m)   Wt 62.1 kg   SpO2 100%   BMI 23.52 kg/m   PROVIDERS: Windell Hummingbird, PA-C is PCP   Shirlee More, MD is Cardiologist  LABS: Labs reviewed: Acceptable for surgery. (all labs ordered are listed, but only abnormal results are displayed)  Labs Reviewed  BASIC METABOLIC PANEL - Abnormal; Notable for the following components:      Result Value   Potassium 3.4 (*)    Glucose, Bld 117 (*)    Creatinine, Ser 1.02 (*)     GFR, Estimated 59 (*)    All other components within normal limits  HEMOGLOBIN A1C - Abnormal; Notable for the following components:   Hgb A1c MFr Bld 5.9 (*)    All other components within normal limits  GLUCOSE, CAPILLARY - Abnormal; Notable for the following components:   Glucose-Capillary 117 (*)    All other components within normal limits  SURGICAL PCR SCREEN  CBC     IMAGES:   EKG: 07/02/2020 Rate 60 bpm  NSR LAD Left ventricular hypertrophy with QRS widening and repolarization abnormality  CV: Cardiac Cath 09/04/2018  There is severe left ventricular systolic dysfunction.  LV end diastolic pressure is moderately elevated.  The left ventricular ejection fraction is less than 25% by visual estimate.   Past Medical History:  Diagnosis Date  . Acute bronchitis   . Anemia   . CHF (congestive heart failure) ([place])   . Diabetes mellitus without complication ([place])   . Drug use   . Epistaxis 11/22/2018  . Hyperlipidemia   . Hypertension   . Hypertensive heart disease with heart failure ([place]) 08/29/2018   Essential hypertension  . Impaired glucose tolerance   . Left ventricular dysfunction 08/29/2018   Left ventricular dysfunction  . Leukemia ([place])   . LVH (left ventricular hypertrophy)   . Menopause   . Pneumonia of right lower lobe due to infectious organism 10/23/2018  . 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  . Primary osteoarthritis of right hip 05/01/2018  . Severe mitral regurgitation 08/29/2018   Mitral  regurgitation  . Tobacco abuse 08/29/2018   Tobacco abuse    Past Surgical History:  Procedure Laterality Date  . CARDIAC CATHETERIZATION    . CESAREAN SECTION    . CHOLECYSTECTOMY    . RIGHT/LEFT HEART CATH AND CORONARY ANGIOGRAPHY N/A 09/04/2018   Procedure: RIGHT/LEFT HEART CATH AND CORONARY ANGIOGRAPHY;  Surgeon: Lorretta Harp, MD;  Location: [place] [facility];  Service: Cardiovascular;  Laterality: N/A;    MEDICATIONS: . albuterol  (VENTOLIN HFA) 108 (90 Base) MCG/ACT inhaler  . aspirin EC 81 MG tablet  . Budeson-Glycopyrrol-Formoterol (BREZTRI AEROSPHERE) 160-9-4.8 MCG/ACT AERO  . carvedilol (COREG) 25 MG tablet  . cetirizine (ZYRTEC) 10 MG tablet  . cyclobenzaprine (FLEXERIL) 10 MG tablet  . ENTRESTO 97-103 MG  . gabapentin (NEURONTIN) 100 MG capsule  . hydrocortisone 2.5 % cream  . hydrOXYzine (ATARAX/VISTARIL) 25 MG tablet  . omeprazole (PRILOSEC) 40 MG capsule  . PROAIR HFA 108 (90 Base) MCG/ACT inhaler  . sodium chloride (OCEAN) 0.65 % nasal spray  . tiZANidine (ZANAFLEX) 2 MG tablet  . traMADol (ULTRAM) 50 MG tablet  . venlafaxine (EFFEXOR) 75 MG tablet   No current facility-administered medications for this encounter.    Konrad Felix, PA-C [facility]Surgical Testing [phone]

## 2020-09-04 NOTE — H&P (Signed)
Patient's anticipated LOS is less than 2 midnights, meeting these requirements: - Younger than 28 - Lives within 1 hour of care - Has a competent adult at home to recover with post-op recover - NO history of  - Chronic pain requiring opiods  - Diabetes  - Coronary Artery Disease  - Heart failure  - Heart attack  - Stroke  - DVT/VTE  - Cardiac arrhythmia  - Respiratory Failure/COPD  - Renal failure  - Anemia  - Advanced Liver disease       Yvonne Campbell is an 70 y.o. female.    Chief Complaint: right shoulder pain  HPI: Pt is a 70 y.o. female complaining of right shoulder pain for multiple years. Pain had continually increased since the beginning. X-rays in the clinic show end-stage arthritic changes of the right shoulder. Pt has tried various conservative treatments which have failed to alleviate their symptoms, including injections and therapy. Various options are discussed with the patient. Risks, benefits and expectations were discussed with the patient. Patient understand the risks, benefits and expectations and wishes to proceed with surgery.   PCP:  Windell Hummingbird, PA-C  D/C Plans: Home  PMH: Past Medical History:  Diagnosis Date  . Acute bronchitis   . Anemia   . CHF (congestive heart failure) ([place])   . Diabetes mellitus without complication ([place])   . Drug use   . Epistaxis 11/22/2018  . Hyperlipidemia   . Hypertension   . Hypertensive heart disease with heart failure ([place]) 08/29/2018   Essential hypertension  . Impaired glucose tolerance   . Left ventricular dysfunction 08/29/2018   Left ventricular dysfunction  . Leukemia ([place])   . LVH (left ventricular hypertrophy)   . Menopause   . Pneumonia of right lower lobe due to infectious organism 10/23/2018  . 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  . Primary osteoarthritis of right hip 05/01/2018  . Severe mitral regurgitation 08/29/2018   Mitral regurgitation  . Tobacco abuse 08/29/2018   Tobacco abuse     PSH: Past Surgical History:  Procedure Laterality Date  . CARDIAC CATHETERIZATION    . CESAREAN SECTION    . CHOLECYSTECTOMY    . RIGHT/LEFT HEART CATH AND CORONARY ANGIOGRAPHY N/A 09/04/2018   Procedure: RIGHT/LEFT HEART CATH AND CORONARY ANGIOGRAPHY;  Surgeon: Lorretta Harp, MD;  Location: [facility];  Service: Cardiovascular;  Laterality: N/A;    Social History:  reports that she quit smoking about 22 months ago. Her smoking use included cigarettes. She has a 17.50 pack-year smoking history. She has never used smokeless tobacco. She reports previous alcohol use. She reports previous drug use. Drug: Marijuana.  Allergies:  Allergies  Allergen Reactions  . Antiseborrheic Products, Misc. Swelling  . Latex Rash  . Penicillins Swelling    Did it involve swelling of the face/tongue/throat, SOB, or low BP? Unknown Did it involve sudden or severe rash/hives, skin peeling, or any reaction on the inside of your mouth or nose? Unknown Did you need to seek medical attention at a hospital or doctor's office? Unknown When did it last happen?unk If all above answers are "NO", may proceed with cephalosporin use.   . Sulfa Antibiotics Itching and Rash    Medications: No current facility-administered medications for this encounter.   Current Outpatient Medications  Medication Sig Dispense Refill  . albuterol (VENTOLIN HFA) 108 (90 Base) MCG/ACT inhaler Inhale 1-2 puffs into the lungs every 4 (four) hours as needed for wheezing or shortness of breath.    Marland Kitchen  aspirin EC 81 MG tablet Take 81 mg by mouth daily.    . Budeson-Glycopyrrol-Formoterol (BREZTRI AEROSPHERE) 160-9-4.8 MCG/ACT AERO Inhale 1 puff into the lungs daily as needed (SOB/ Wheezing).    . carvedilol (COREG) 25 MG tablet Take 25 mg by mouth 2 (two) times daily.    . cetirizine (ZYRTEC) 10 MG tablet Take 10 mg by mouth daily.    . cyclobenzaprine (FLEXERIL) 10 MG tablet Take 10 mg by mouth 3 (three) times daily  as needed for muscle spasms.     Marland Kitchen ENTRESTO 97-103 MG Take 1 tablet by mouth 2 (two) times a day.    . gabapentin (NEURONTIN) 100 MG capsule Take 200 mg by mouth 2 (two) times daily.    . hydrocortisone 2.5 % cream Apply 1 application topically 2 (two) times daily as needed (irritation).    . hydrOXYzine (ATARAX/VISTARIL) 25 MG tablet Take 25 mg by mouth at bedtime.    Marland Kitchen omeprazole (PRILOSEC) 40 MG capsule Take 40 mg by mouth daily as needed (acid reflux).    Marland Kitchen PROAIR HFA 108 (90 Base) MCG/ACT inhaler Inhale 2 puffs into the lungs every 6 (six) hours as needed for wheezing or shortness of breath.    . sodium chloride (OCEAN) 0.65 % nasal spray Place 2 sprays into the nose as needed for congestion.    Marland Kitchen tiZANidine (ZANAFLEX) 2 MG tablet Take 2 mg by mouth at bedtime.    . traMADol (ULTRAM) 50 MG tablet Take 50 mg by mouth daily.    Marland Kitchen venlafaxine (EFFEXOR) 75 MG tablet Take 75 mg by mouth daily.      No results found for this or any previous visit (from the past 48 hour(s)). No results found.  ROS: Pain with rom of the right upper extremity  Physical Exam: Alert and oriented 70 y.o. female in no acute distress Cranial nerves 2-12 intact Cervical spine: full rom with no tenderness, nv intact distally Chest: active breath sounds bilaterally, no wheeze rhonchi or rales Heart: regular rate and rhythm, no murmur Abd: non tender non distended with active bowel sounds Hip is stable with rom  Right shoulder pain with rom and crepitus nv intact distally No rashes or edema distally  Assessment/Plan Assessment: right shoulder cuff arthropathy  Plan:  Patient will undergo a right reverse total shoulder by Dr. Veverly Fells at [facility] Risks benefits and expectations were discussed with the patient. Patient understand risks, benefits and expectations and wishes to proceed. Preoperative templating of the joint replacement has been completed, documented, and submitted to the Operating Room personnel  in order to optimize intra-operative equipment management.   Merla Riches PA-C, MPAS [facility] is now [employer] [address], [place], [place], [place] [address] Phone: [phone] [email or web address] Facebook  [employer]

## 2020-09-05 ENCOUNTER — Encounter (HOSPITAL_COMMUNITY)
Admission: RE | Disposition: A | Discharge status: Home / Self Care | Payer: Self-pay | Source: Home / Self Care | Attending: Orthopedic Surgery

## 2020-09-05 ENCOUNTER — Ambulatory Visit (HOSPITAL_COMMUNITY)
Admission: RE | Admit: 2020-09-05 | Discharge: 2020-09-05 | Disposition: A | Discharge status: Home / Self Care | Payer: Medicare HMO | Attending: Orthopedic Surgery | Admitting: Orthopedic Surgery

## 2020-09-05 ENCOUNTER — Ambulatory Visit (HOSPITAL_COMMUNITY): Payer: Medicare HMO | Admitting: Physician Assistant

## 2020-09-05 ENCOUNTER — Ambulatory Visit (HOSPITAL_COMMUNITY): Payer: Medicare HMO | Admitting: Certified Registered"

## 2020-09-05 ENCOUNTER — Encounter (HOSPITAL_COMMUNITY): Payer: Self-pay | Admitting: Orthopedic Surgery

## 2020-09-05 DIAGNOSIS — Z79899 Other long term (current) drug therapy: Secondary | ICD-10-CM | POA: Insufficient documentation

## 2020-09-05 DIAGNOSIS — I509 Heart failure, unspecified: Secondary | ICD-10-CM | POA: Diagnosis not present

## 2020-09-05 DIAGNOSIS — Z880 Allergy status to penicillin: Secondary | ICD-10-CM | POA: Diagnosis not present

## 2020-09-05 DIAGNOSIS — Z888 Allergy status to other drugs, medicaments and biological substances status: Secondary | ICD-10-CM | POA: Insufficient documentation

## 2020-09-05 DIAGNOSIS — Z7951 Long term (current) use of inhaled steroids: Secondary | ICD-10-CM | POA: Diagnosis not present

## 2020-09-05 DIAGNOSIS — Z856 Personal history of leukemia: Secondary | ICD-10-CM | POA: Insufficient documentation

## 2020-09-05 DIAGNOSIS — E119 Type 2 diabetes mellitus without complications: Secondary | ICD-10-CM | POA: Diagnosis not present

## 2020-09-05 DIAGNOSIS — Z882 Allergy status to sulfonamides status: Secondary | ICD-10-CM | POA: Insufficient documentation

## 2020-09-05 DIAGNOSIS — Z7982 Long term (current) use of aspirin: Secondary | ICD-10-CM | POA: Insufficient documentation

## 2020-09-05 DIAGNOSIS — M19011 Primary osteoarthritis, right shoulder: Secondary | ICD-10-CM | POA: Diagnosis present

## 2020-09-05 DIAGNOSIS — I110 Hypertensive heart disease with heart failure: Secondary | ICD-10-CM | POA: Insufficient documentation

## 2020-09-05 DIAGNOSIS — Z87891 Personal history of nicotine dependence: Secondary | ICD-10-CM | POA: Insufficient documentation

## 2020-09-05 HISTORY — PX: REVERSE SHOULDER ARTHROPLASTY: SHX5054

## 2020-09-05 LAB — GLUCOSE, CAPILLARY: Glucose-Capillary: 99 mg/dL (ref 70–99)

## 2020-09-05 SURGERY — ARTHROPLASTY, SHOULDER, TOTAL, REVERSE
Anesthesia: General | Site: Shoulder | Laterality: Right

## 2020-09-05 MED ORDER — EPHEDRINE SULFATE-NACL 50-0.9 MG/10ML-% IV SOSY
PREFILLED_SYRINGE | INTRAVENOUS | Status: DC | PRN
  Administered 2020-09-05 (×3): 10 mg via INTRAVENOUS

## 2020-09-05 MED ORDER — ROCURONIUM BROMIDE 10 MG/ML (PF) SYRINGE
PREFILLED_SYRINGE | INTRAVENOUS | Status: AC
  Filled 2020-09-05: qty 10

## 2020-09-05 MED ORDER — PROPOFOL 10 MG/ML IV BOLUS
INTRAVENOUS | Status: DC | PRN
  Administered 2020-09-05: 100 mg via INTRAVENOUS

## 2020-09-05 MED ORDER — ORAL CARE MOUTH RINSE
15.0000 mL | Freq: Once | OROMUCOSAL | Status: AC
  Administered 2020-09-05: 15 mL via OROMUCOSAL

## 2020-09-05 MED ORDER — BUPIVACAINE-EPINEPHRINE (PF) 0.25% -1:200000 IJ SOLN
INTRAMUSCULAR | Status: DC | PRN
  Administered 2020-09-05: 5 mL via PERINEURAL

## 2020-09-05 MED ORDER — LIDOCAINE 2% (20 MG/ML) 5 ML SYRINGE
INTRAMUSCULAR | Status: DC | PRN
  Administered 2020-09-05: 60 mg via INTRAVENOUS

## 2020-09-05 MED ORDER — PROPOFOL 10 MG/ML IV BOLUS
INTRAVENOUS | Status: AC
  Filled 2020-09-05: qty 40

## 2020-09-05 MED ORDER — SUCCINYLCHOLINE CHLORIDE 200 MG/10ML IV SOSY
PREFILLED_SYRINGE | INTRAVENOUS | Status: DC | PRN
  Administered 2020-09-05: 100 mg via INTRAVENOUS

## 2020-09-05 MED ORDER — SUCCINYLCHOLINE CHLORIDE 200 MG/10ML IV SOSY
PREFILLED_SYRINGE | INTRAVENOUS | Status: AC
  Filled 2020-09-05: qty 10

## 2020-09-05 MED ORDER — CEFAZOLIN SODIUM-DEXTROSE 2-4 GM/100ML-% IV SOLN
2.0000 g | INTRAVENOUS | Status: AC
  Administered 2020-09-05: 2 g via INTRAVENOUS
  Filled 2020-09-05: qty 100

## 2020-09-05 MED ORDER — CHLORHEXIDINE GLUCONATE 0.12 % MT SOLN: 15.0000 mL | Freq: Once | OROMUCOSAL | Status: AC

## 2020-09-05 MED ORDER — MIDAZOLAM HCL 2 MG/2ML IJ SOLN
INTRAMUSCULAR | Status: AC
  Filled 2020-09-05: qty 2

## 2020-09-05 MED ORDER — PHENYLEPHRINE HCL (PRESSORS) 10 MG/ML IV SOLN
INTRAVENOUS | Status: AC
  Filled 2020-09-05: qty 1

## 2020-09-05 MED ORDER — BUPIVACAINE HCL (PF) 0.5 % IJ SOLN
INTRAMUSCULAR | Status: DC | PRN
  Administered 2020-09-05: 10 mL via PERINEURAL

## 2020-09-05 MED ORDER — PHENYLEPHRINE HCL-NACL 10-0.9 MG/250ML-% IV SOLN
INTRAVENOUS | Status: DC | PRN
  Administered 2020-09-05: 20 ug/min via INTRAVENOUS

## 2020-09-05 MED ORDER — EPHEDRINE 5 MG/ML INJ
INTRAVENOUS | Status: AC
  Filled 2020-09-05: qty 10

## 2020-09-05 MED ORDER — GLYCOPYRROLATE 0.2 MG/ML IJ SOLN
INTRAMUSCULAR | Status: DC | PRN
  Administered 2020-09-05: .2 mg via INTRAVENOUS

## 2020-09-05 MED ORDER — BUPIVACAINE LIPOSOME 1.3 % IJ SUSP
INTRAMUSCULAR | Status: DC | PRN
  Administered 2020-09-05: 10 mL via PERINEURAL

## 2020-09-05 MED ORDER — PHENYLEPHRINE 40 MCG/ML (10ML) SYRINGE FOR IV PUSH (FOR BLOOD PRESSURE SUPPORT)
PREFILLED_SYRINGE | INTRAVENOUS | Status: DC | PRN
  Administered 2020-09-05: 120 ug via INTRAVENOUS

## 2020-09-05 MED ORDER — 0.9 % SODIUM CHLORIDE (POUR BTL) OPTIME
TOPICAL | Status: DC | PRN
  Administered 2020-09-05: 1000 mL

## 2020-09-05 MED ORDER — ONDANSETRON HCL 4 MG/2ML IJ SOLN
INTRAMUSCULAR | Status: AC
  Filled 2020-09-05: qty 2

## 2020-09-05 MED ORDER — TIZANIDINE HCL 2 MG PO TABS: 2.0000 mg | ORAL_TABLET | Freq: Three times a day (TID) | ORAL | 0 refills | Status: DC | PRN

## 2020-09-05 MED ORDER — FENTANYL CITRATE (PF) 250 MCG/5ML IJ SOLN
INTRAMUSCULAR | Status: DC | PRN
  Administered 2020-09-05 (×2): 50 ug via INTRAVENOUS

## 2020-09-05 MED ORDER — PHENYLEPHRINE 40 MCG/ML (10ML) SYRINGE FOR IV PUSH (FOR BLOOD PRESSURE SUPPORT)
PREFILLED_SYRINGE | INTRAVENOUS | Status: AC
  Filled 2020-09-05: qty 10

## 2020-09-05 MED ORDER — DEXAMETHASONE SODIUM PHOSPHATE 10 MG/ML IJ SOLN
INTRAMUSCULAR | Status: DC | PRN
  Administered 2020-09-05: 4 mg via INTRAVENOUS

## 2020-09-05 MED ORDER — BUPIVACAINE-EPINEPHRINE (PF) 0.25% -1:200000 IJ SOLN
INTRAMUSCULAR | Status: AC
  Filled 2020-09-05: qty 30

## 2020-09-05 MED ORDER — ACETAMINOPHEN 500 MG PO TABS
1000.0000 mg | ORAL_TABLET | Freq: Once | ORAL | Status: AC
  Administered 2020-09-05: 1000 mg via ORAL
  Filled 2020-09-05: qty 2

## 2020-09-05 MED ORDER — MIDAZOLAM HCL 2 MG/2ML IJ SOLN
INTRAMUSCULAR | Status: DC | PRN
  Administered 2020-09-05: 1 mg via INTRAVENOUS

## 2020-09-05 MED ORDER — HYDROCODONE-ACETAMINOPHEN 5-325 MG PO TABS
1.0000 | ORAL_TABLET | ORAL | 0 refills | Status: DC | PRN
Start: 2020-09-05 — End: 2021-08-27

## 2020-09-05 MED ORDER — ONDANSETRON HCL 4 MG/2ML IJ SOLN: 4.0000 mg | Freq: Once | INTRAMUSCULAR | Status: DC | PRN

## 2020-09-05 MED ORDER — DEXAMETHASONE SODIUM PHOSPHATE 10 MG/ML IJ SOLN
INTRAMUSCULAR | Status: AC
  Filled 2020-09-05: qty 1

## 2020-09-05 MED ORDER — LACTATED RINGERS IV SOLN: INTRAVENOUS | Status: DC

## 2020-09-05 MED ORDER — FENTANYL CITRATE (PF) 100 MCG/2ML IJ SOLN: 25.0000 ug | INTRAMUSCULAR | Status: DC | PRN

## 2020-09-05 MED ORDER — STERILE WATER FOR IRRIGATION IR SOLN
Status: DC | PRN
  Administered 2020-09-05: 1000 mL

## 2020-09-05 MED ORDER — FENTANYL CITRATE (PF) 100 MCG/2ML IJ SOLN
INTRAMUSCULAR | Status: AC
  Filled 2020-09-05: qty 2

## 2020-09-05 MED ORDER — ONDANSETRON HCL 4 MG/2ML IJ SOLN
INTRAMUSCULAR | Status: DC | PRN
  Administered 2020-09-05: 4 mg via INTRAVENOUS

## 2020-09-05 SURGICAL SUPPLY — 74 items
BAG ZIPLOCK 12X15 (MISCELLANEOUS) IMPLANT
BIT DRILL 1.6MX128 (BIT) IMPLANT
BIT DRILL 1.6MX128MM (BIT)
BIT DRILL 170X2.5X (BIT) ×1 IMPLANT
BIT DRL 170X2.5X (BIT) ×1
BLADE SAG 18X100X1.27 (BLADE) ×3 IMPLANT
CLOSURE STERI-STRIP 1/2X4 (GAUZE/BANDAGES/DRESSINGS) ×1
CLOSURE WOUND 1/2 X4 (GAUZE/BANDAGES/DRESSINGS) ×1
CLSR STERI-STRIP ANTIMIC 1/2X4 (GAUZE/BANDAGES/DRESSINGS) ×2 IMPLANT
COVER BACK TABLE 60X90IN (DRAPES) ×3 IMPLANT
COVER SURGICAL LIGHT HANDLE (MISCELLANEOUS) ×3 IMPLANT
COVER WAND RF STERILE (DRAPES) IMPLANT
CUP D38 DXTEND STAND PLUS 6 HU (Orthopedic Implant) ×3 IMPLANT
CUP STD D38 DXTEND PLUS 6 HU (Orthopedic Implant) ×1 IMPLANT
DECANTER SPIKE VIAL GLASS SM (MISCELLANEOUS) ×3 IMPLANT
DRAPE INCISE IOBAN 66X45 STRL (DRAPES) ×3 IMPLANT
DRAPE ORTHO SPLIT 77X108 STRL (DRAPES) ×4
DRAPE SHEET LG 3/4 BI-LAMINATE (DRAPES) ×3 IMPLANT
DRAPE SURG ORHT 6 SPLT 77X108 (DRAPES) ×2 IMPLANT
DRAPE TOP 10253 STERILE (DRAPES) ×3 IMPLANT
DRAPE U-SHAPE 47X51 STRL (DRAPES) ×3 IMPLANT
DRILL 2.5 (BIT) ×2
DRSG ADAPTIC 3X8 NADH LF (GAUZE/BANDAGES/DRESSINGS) ×3 IMPLANT
DRSG PAD ABDOMINAL 8X10 ST (GAUZE/BANDAGES/DRESSINGS) ×3 IMPLANT
DURAPREP 26ML APPLICATOR (WOUND CARE) ×3 IMPLANT
ECCENTRIC EPIPHYSI MODULAR SZ1 (Trauma) ×1 IMPLANT
ELECT BLADE TIP CTD 4 INCH (ELECTRODE) ×3 IMPLANT
ELECT NEEDLE TIP 2.8 STRL (NEEDLE) ×3 IMPLANT
ELECT REM PT RETURN 15FT ADLT (MISCELLANEOUS) ×3 IMPLANT
FACESHIELD WRAPAROUND (MASK) ×3 IMPLANT
GAUZE SPONGE 4X4 12PLY STRL (GAUZE/BANDAGES/DRESSINGS) ×3 IMPLANT
GLENOSPHERE DELTA XTEND LAT 38 (Miscellaneous) ×3 IMPLANT
GLOVE BIOGEL PI ORTHO PRO 7.5 (GLOVE) ×2
GLOVE BIOGEL PI ORTHO PRO SZ8 (GLOVE) ×2
GLOVE ORTHO TXT STRL SZ7.5 (GLOVE) ×3 IMPLANT
GLOVE PI ORTHO PRO STRL 7.5 (GLOVE) ×1 IMPLANT
GLOVE PI ORTHO PRO STRL SZ8 (GLOVE) ×1 IMPLANT
GLOVE SURG ORTHO LTX SZ8.5 (GLOVE) ×3 IMPLANT
GOWN STRL REUS W/TWL XL LVL3 (GOWN DISPOSABLE) ×6 IMPLANT
KIT BASIN OR (CUSTOM PROCEDURE TRAY) ×3 IMPLANT
KIT TURNOVER KIT A (KITS) ×3 IMPLANT
MANIFOLD NEPTUNE II (INSTRUMENTS) ×3 IMPLANT
METAGLENE DELTA EXTEND (Trauma) ×1 IMPLANT
METAGLENE DXTEND (Trauma) ×3 IMPLANT
MODULAR ECCENTRIC EPIPHYSI SZ1 (Trauma) ×3 IMPLANT
NEEDLE MAYO CATGUT SZ4 (NEEDLE) IMPLANT
NS IRRIG 1000ML POUR BTL (IV SOLUTION) ×3 IMPLANT
PACK SHOULDER (CUSTOM PROCEDURE TRAY) ×3 IMPLANT
PENCIL SMOKE EVACUATOR (MISCELLANEOUS) IMPLANT
PIN GUIDE 1.2 (PIN) ×3 IMPLANT
PIN GUIDE GLENOPHERE 1.5MX300M (PIN) ×3 IMPLANT
PIN METAGLENE 2.5 (PIN) ×3 IMPLANT
PROTECTOR NERVE ULNAR (MISCELLANEOUS) ×3 IMPLANT
RESTRAINT HEAD UNIVERSAL NS (MISCELLANEOUS) ×3 IMPLANT
SCREW 4.5X18MM (Screw) ×2 IMPLANT
SCREW BN 18X4.5XSTRL SHLDR (Screw) ×1 IMPLANT
SCREW LOCK 42 (Screw) ×3 IMPLANT
SCREW LOCK DELTA XTEND 4.5X30 (Screw) ×3 IMPLANT
SLING ARM FOAM STRAP LRG (SOFTGOODS) IMPLANT
SMARTMIX MINI TOWER (MISCELLANEOUS)
SPONGE LAP 4X18 RFD (DISPOSABLE) IMPLANT
STEM 12 HA (Stem) ×3 IMPLANT
STRIP CLOSURE SKIN 1/2X4 (GAUZE/BANDAGES/DRESSINGS) ×2 IMPLANT
SUCTION FRAZIER HANDLE 10FR (MISCELLANEOUS) ×2
SUCTION TUBE FRAZIER 10FR DISP (MISCELLANEOUS) ×1 IMPLANT
SUT FIBERWIRE #2 38 T-5 BLUE (SUTURE) ×6
SUT MNCRL AB 4-0 PS2 18 (SUTURE) ×3 IMPLANT
SUT VIC AB 0 CT1 36 (SUTURE) ×6 IMPLANT
SUT VIC AB 0 CT2 27 (SUTURE) ×3 IMPLANT
SUT VIC AB 2-0 CT1 27 (SUTURE) ×2
SUT VIC AB 2-0 CT1 TAPERPNT 27 (SUTURE) ×1 IMPLANT
SUTURE FIBERWR #2 38 T-5 BLUE (SUTURE) ×2 IMPLANT
TOWEL OR 17X26 10 PK STRL BLUE (TOWEL DISPOSABLE) ×3 IMPLANT
TOWER SMARTMIX MINI (MISCELLANEOUS) IMPLANT

## 2020-09-05 NOTE — Evaluation (Signed)
Occupational Therapy Evaluation Patient Details Name: Yvonne Campbell MRN: [id number] DOB: 1951-04-19 Today's Date: 09/05/2020    History of Present Illness Patient s/p right reverse shoulder arthroplasty.   Clinical Impression   Yvonne Campbell is a 70 year old woman s/p reverse shoulder replacement without functional use of right dominant upper extremity secondary to effects of surgery and interscalene block and shoulder precautions. Therapist provided education and instruction to patient and daughter (over video) in regards to exercises, precautions, positioning, donning upper extremity clothing and bathing while maintaining shoulder precautions, ice and edema management and donning/doffing sling. Patient and spouse verbalized understanding and demonstrated as needed. Patient needed assistance to donn shirt and fasten jeans. Patient provided with instruction on compensatory strategies for dressing. Patient to follow up with MD for further therapy needs.      Follow Up Recommendations  Follow surgeon's recommendation for DC plan and follow-up therapies    Equipment Recommendations  None recommended by OT    Recommendations for Other Services       Precautions / Restrictions Precautions Precautions: Shoulder Type of Shoulder Precautions: Conservative Protcol per Dr. Veverly Fells Shoulder Interventions: Shoulder sling/immobilizer;At all times;Off for dressing/bathing/exercises Restrictions Weight Bearing Restrictions: Yes RUE Weight Bearing: Non weight bearing      Mobility Bed Mobility Overal bed mobility: Independent                  Transfers                      Balance Overall balance assessment: No apparent balance deficits (not formally assessed)                                         ADL either performed or assessed with clinical judgement   ADL Overall ADL's : Needs assistance/impaired Eating/Feeding: Set up   Grooming: Set up    Upper Body Bathing: Set up;Adhering to UE precautions;Supervision/ safety   Lower Body Bathing: Set up;Supervison/ safety   Upper Body Dressing : Moderate assistance;Set up;Adhering to UE precautions;Cueing for compensatory techniques Upper Body Dressing Details (indicate cue type and reason): Needed assistance to don shirt due to no active movement yet - instructed on compensatory strategies. Lower Body Dressing: Minimal assistance Lower Body Dressing Details (indicate cue type and reason): min assist for fasteners and pulling jeans up. recommended loose clothing and no fasteners at home (or have assistance). Toilet Transfer: Warehouse manager and Hygiene: Supervision/safety Toileting - Clothing Manipulation Details (indicate cue type and reason): reports being able to wipe with left hand.             Vision Patient Visual Report: No change from baseline       Perception     Praxis      Pertinent Vitals/Pain Pain Assessment: No/denies pain     Hand Dominance     Extremity/Trunk Assessment Upper Extremity Assessment Upper Extremity Assessment: RUE deficits/detail RUE Deficits / Details: No active movement secondary to interscalene block.   Lower Extremity Assessment Lower Extremity Assessment: Overall WFL for tasks assessed   Cervical / Trunk Assessment Cervical / Trunk Assessment: Normal   Communication     Cognition Arousal/Alertness: Awake/alert Behavior During Therapy: WFL for tasks assessed/performed Overall Cognitive Status: Within Functional Limits for tasks assessed  General Comments       Exercises     Shoulder Instructions Shoulder Instructions Donning/doffing shirt without moving shoulder: Patient able to independently direct caregiver Method for sponge bathing under operated UE: Independent Donning/doffing sling/immobilizer: Independent Correct positioning of  sling/immobilizer: Independent ROM for elbow, wrist and digits of operated UE: Independent Sling wearing schedule (on at all times/off for ADL's): Independent Proper positioning of operated UE when showering: Independent Dressing change: Independent Positioning of UE while sleeping: [place] expects to be discharged to:: Private residence Living Arrangements: Spouse/significant other;Children Available Help at Discharge: Family;Available 24 hours/day                                    Prior Functioning/Environment                   OT Problem List: Decreased strength;Decreased range of motion;Impaired UE functional use;Decreased knowledge of precautions      OT Treatment/Interventions: Self-care/ADL training    OT Goals(Current goals can be found in the care plan section) Acute Rehab OT Goals OT Goal Formulation: All assessment and education complete, DC therapy  OT Frequency:     Barriers to D/C:            Co-evaluation              AM-PAC OT "6 Clicks" Daily Activity     Outcome Measure Help from another person eating meals?: A Little Help from another person taking care of personal grooming?: A Little Help from another person toileting, which includes using toliet, bedpan, or urinal?: A Little Help from another person bathing (including washing, rinsing, drying)?: A Little Help from another person to put on and taking off regular upper body clothing?: A Little Help from another person to put on and taking off regular lower body clothing?: A Little 6 Click Score: 18   End of Session Nurse Communication: Other (comment) (Ot education complete)  Activity Tolerance: Patient tolerated treatment well Patient left: in chair  OT Visit Diagnosis: Muscle weakness (generalized) (M62.81)                Time: 1517-6160 OT Time Calculation (min): 24 min Charges:  OT General Charges $OT Visit: 1 Visit OT  Treatments $Self Care/Home Management : 8-22 mins  Ying Blankenhorn, OTR/L [place]  Office [phone] Pager: [phone]   Lenward Chancellor 09/05/2020, 12:25 PM

## 2020-09-05 NOTE — Transfer of Care (Signed)
Immediate Anesthesia Transfer of Care Note  Patient: Yvonne Campbell  Procedure(s) Performed: REVERSE SHOULDER ARTHROPLASTY (Right Shoulder)  Patient Location: PACU  Anesthesia Type:General  Level of Consciousness: awake, alert  and patient cooperative  Airway & Oxygen Therapy: Patient Spontanous Breathing and Patient connected to face mask oxygen  Post-op Assessment: Report given to RN and Post -op Vital signs reviewed and stable  Post vital signs: Reviewed and stable  Last Vitals:  Vitals Value Taken Time  BP 161/100 09/05/20 0930  Temp    Pulse 73 09/05/20 0930  Resp 20 09/05/20 0930  SpO2 100 % 09/05/20 0930  Vitals shown include unvalidated device data.  Last Pain:  Vitals:   09/05/20 0610  TempSrc: Oral         Complications: No complications documented.

## 2020-09-05 NOTE — Discharge Instructions (Signed)
Ice to the shoulder constantly.  Keep the incision covered and clean and dry for one week, then ok to get it wet in the shower. ° °Do exercise as instructed several times per day. ° °DO NOT reach behind your back or push up out of a chair with the operative arm. ° °Use a sling while you are up and around for comfort, may remove while seated.  Keep pillow propped behind the operative elbow. ° °Follow up with Dr Keli Buehner in two weeks in the office, call [phone] for appt °

## 2020-09-05 NOTE — Op Note (Signed)
NAME: Yvonne Campbell, Yvonne Campbell MEDICAL RECORD ZO:[id number] ACCOUNT [id number] DATE OF BIRTH:Mar 02, 1951 FACILITY: [facility] LOCATION: [facility] PHYSICIAN:STEVEN Orlena Sheldon, MD  OPERATIVE REPORT  DATE OF PROCEDURE:  09/05/2020  PREOPERATIVE DIAGNOSIS:  Right shoulder end-stage osteoarthritis.  POSTOPERATIVE DIAGNOSIS:  Right shoulder end-stage osteoarthritis.  PROCEDURE PERFORMED:  Right reverse shoulder replacement using DePuy Delta Xtend prosthesis with subscap repair.  ATTENDING SURGEON:  Esmond Plants, MD  ASSISTANT:  Darol Destine, [place], who was scrubbed during the entire procedure and necessary for satisfactory completion of surgery.  ANESTHESIA:  General anesthesia plus interscalene block was used.  ESTIMATED BLOOD LOSS:  Less than 100 mL.  FLUID REPLACEMENT:  1500 mL crystalloid.  INSTRUMENT COUNTS:  Correct.  COMPLICATIONS:  There were no complications.  ANTIBIOTICS:  Perioperative antibiotics were given.  INDICATIONS:  The patient is a 70 year old female with worsening shoulder pain and dysfunction secondary to end-stage arthritis and rotator cuff insufficiency.  The patient has had progressive pain despite conservative management and presents now for  reverse shoulder replacement to restore fixed focal mechanics to the shoulder eliminating pain and restoring function.  Informed consent obtained.  DESCRIPTION OF PROCEDURE:  After an adequate level of anesthesia was achieved, the patient was positioned in modified beach chair position.  Right shoulder correctly identified and sterilely prepped and draped in the usual manner.  Time-out called,  verifying correct patient, correct site.  We entered the patient's shoulder using a standard deltopectoral incision started at the coracoid process extending down to the anterior humerus, dissection down through subcutaneous tissues using Bovie.  We  identified the cephalic vein and took that laterally with the deltoid pectoralis taken  medially.  Conjoined tendon identified and retracted medially.  Deep retractors placed.  We tenodesed the biceps in situ with 0 Vicryl figure-of-eight suture.  We then  released the subscap and tagged that with #2 FiberWire to repair at the end.  We released the inferior capsule progressively externally rotating and delivering the humeral head out of the wound.  There was no cartilage remaining on the humeral head  whatsoever.  We went ahead and entered the proximal humerus with a 66 mm reamer, reamed up to a size 12.  We then placed our 12 T-handled intramedullary resection guide and resected the head at 10 degrees of retroversion with the oscillating saw, we  removed excess osteophytes with a rongeur.  We then went ahead and subluxed the humerus posteriorly, gaining good exposure of the glenoid.  We removed the biceps and the capsule and the labrum.  We then found the center point and drilled a guide pin for  our metaglene baseplate preparation.  We then reamed for the metaglene baseplate, drilled out our central peg hole, did our peripheral hand reaming and then impacted the metaglene baseplate into position.  We were able to get a 42 screw inferiorly, a 30  proximally into the base of coracoid and an 18 nonlocked anteriorly.  We had excellent baseplate security.  We then went ahead and placed our 38 standard glenosphere onto the baseplate and screwed that onto the baseplate securely.  Next, we addressed the  humerus.  We reamed for the 1 right metaphysis and then trialed with the 12 stem 1 right metaphysis set on the 0 setting and placed in 10 degrees of retroversion.  We had excellent coverage of the exposed bone proximally.  We reduced with a 38+3 poly  and had nice stability, felt like I could probably get a +6 in for the real.  We removed all trial components.  We drilled holes in lesser tuberosity and placed #2 FiberWire suture for repair of the subscap.  We then irrigated the canal and then using   impaction grafting technique with available bone graft from the humeral head and impacted the HA coated press-fit 12 stem and a 1 right metaphysis set on the 0 setting and impacted in 10 degrees of retroversion.  Once that was in place we went ahead and  selected the real 38+6 poly impacted down the humeral tray.  We reduced the shoulder, had nice little pop as it reduced.  Appropriate tension on the conjoined not too tight and no gapping with inferior pole or external rotation.  At this point, we went  ahead and closed in the following fashion.  We repaired the subscapularis anatomically using sutures through bone initially, this did not restrict our range of motion whatsoever.  We then went ahead and did a deltopectoral closure with 0 Vicryl suture  followed by 2-0 Vicryl for subcutaneous closure, running 4-0 Monocryl for skin.  Steri-Strips applied followed by sterile dressing.  The patient tolerated the surgery well.  HN/NUANCE  D:09/05/2020 T:09/05/2020 JOB:[id number]

## 2020-09-05 NOTE — Brief Op Note (Signed)
09/05/2020  9:26 AM  PATIENT:  Yvonne Campbell  70 y.o. female  PRE-OPERATIVE DIAGNOSIS:  right shoulder osteoarthritis, end stage  POST-OPERATIVE DIAGNOSIS:  right shoulder osteoarthritis, end stage  PROCEDURE:  Procedure(s) with comments: [place] (Right) - with interscalene block DePuy Delta Xtrend with subscap repair    SURGEON:  Surgeon(s) and Role:    Netta Cedars, MD - Primary  PHYSICIAN ASSISTANT:   ASSISTANTS: [place] Bruns, PA-C   ANESTHESIA:   regional and general  EBL:  100 mL   BLOOD ADMINISTERED:none  DRAINS: none   LOCAL MEDICATIONS USED:  MARCAINE     SPECIMEN:  No Specimen  DISPOSITION OF SPECIMEN:  N/A  COUNTS:  YES  TOURNIQUET:  * No tourniquets in log *  DICTATION: .Other Dictation: Dictation Number [id number]  PLAN OF CARE: Discharge to home after PACU  PATIENT DISPOSITION:  PACU - hemodynamically stable.   Delay start of Pharmacological VTE agent (>24hrs) due to surgical blood loss or risk of bleeding: not applicable

## 2020-09-05 NOTE — Anesthesia Postprocedure Evaluation (Signed)
Anesthesia Post Note  Patient: Yvonne Campbell  Procedure(s) Performed: REVERSE SHOULDER ARTHROPLASTY (Right Shoulder)     Patient location during evaluation: PACU Anesthesia Type: General Level of consciousness: awake and alert Pain management: pain level controlled Vital Signs Assessment: post-procedure vital signs reviewed and stable Respiratory status: spontaneous breathing, nonlabored ventilation, respiratory function stable and patient connected to nasal cannula oxygen Cardiovascular status: blood pressure returned to baseline and stable Postop Assessment: no apparent nausea or vomiting Anesthetic complications: no   No complications documented.  Last Vitals:  Vitals:   09/05/20 1000 09/05/20 1015  BP: (!) 167/76 139/81  Pulse: (!) 55 75  Resp: 13 13  Temp:    SpO2: 96% 92%    Last Pain:  Vitals:   09/05/20 1015  TempSrc:   PainSc: 0-No pain                 Catalina Gravel

## 2020-09-05 NOTE — Anesthesia Procedure Notes (Signed)
Anesthesia Regional Block: Interscalene brachial plexus block   Pre-Anesthetic Checklist: ,, timeout performed, Correct Patient, Correct Site, Correct Laterality, Correct Procedure, Correct Position, site marked, Risks and benefits discussed,  Surgical consent,  Pre-op evaluation,  At surgeon's request and post-op pain management  Laterality: Right  Prep: chloraprep       Needles:  Injection technique: Single-shot  Needle Type: Echogenic Stimulator Needle     Needle Length: 5cm  Needle Gauge: 22     Additional Needles:   Procedures:,,,, ultrasound used (permanent image in chart),,,,  Narrative:  Start time: 09/05/2020 6:48 AM End time: 09/05/2020 6:53 AM Injection made incrementally with aspirations every 5 mL.  Performed by: Personally  Anesthesiologist: Catalina Gravel, MD  Additional Notes: Functioning IV was confirmed and monitors were applied.  A 70mm 22ga Arrow echogenic stimulator needle was used. Sterile prep and drape, hand hygiene, and sterile gloves were used.  Negative aspiration and negative test dose prior to incremental administration of local anesthetic. The patient tolerated the procedure well.  Ultrasound guidance: relevent anatomy identified, needle position confirmed, local anesthetic spread visualized around nerve(s), vascular puncture avoided.  Image printed for medical record.

## 2020-09-05 NOTE — Anesthesia Procedure Notes (Signed)
Procedure Name: Intubation Date/Time: 09/05/2020 7:39 AM Performed by: Eben Burow, CRNA Pre-anesthesia Checklist: Patient identified, Emergency Drugs available, Suction available, Patient being monitored and Timeout performed Patient Re-evaluated:Patient Re-evaluated prior to induction Oxygen Delivery Method: Circle system utilized Preoxygenation: Pre-oxygenation with 100% oxygen Induction Type: IV induction Ventilation: Mask ventilation without difficulty Laryngoscope Size: Mac and 4 Grade View: Grade I Tube type: Oral Tube size: 7.0 mm Number of attempts: 1 Airway Equipment and Method: Stylet Placement Confirmation: ETT inserted through vocal cords under direct vision,  positive ETCO2 and breath sounds checked- equal and bilateral Secured at: 22 cm Tube secured with: Tape Dental Injury: Teeth and Oropharynx as per pre-operative assessment

## 2020-09-05 NOTE — Interval H&P Note (Signed)
History and Physical Interval Note:  09/05/2020 7:30 AM  [facility] Yvonne Campbell  has presented today for surgery, with the diagnosis of right shoulder osteoarthritis.  The various methods of treatment have been discussed with the patient and family. After consideration of risks, benefits and other options for treatment, the patient has consented to  Procedure(s) with comments: REVERSE SHOULDER ARTHROPLASTY (Right) - with interscalene block as a surgical intervention.  The patient's history has been reviewed, patient examined, no change in status, stable for surgery.  I have reviewed the patient's chart and labs.  Questions were answered to the patient's satisfaction.     [name]

## 2020-09-08 ENCOUNTER — Encounter (HOSPITAL_COMMUNITY): Payer: Self-pay | Admitting: Orthopedic Surgery

## 2020-10-01 ENCOUNTER — Encounter: Payer: Self-pay | Admitting: *Deleted

## 2020-10-01 ENCOUNTER — Other Ambulatory Visit: Payer: Self-pay | Admitting: *Deleted

## 2020-10-06 ENCOUNTER — Encounter: Payer: Self-pay | Admitting: Diagnostic Neuroimaging

## 2020-10-06 ENCOUNTER — Ambulatory Visit (INDEPENDENT_AMBULATORY_CARE_PROVIDER_SITE_OTHER): Payer: Medicare HMO | Admitting: Diagnostic Neuroimaging

## 2020-10-06 VITALS — BP 131/86 | HR 63 | Ht 64.0 in | Wt 140.0 lb

## 2020-10-06 DIAGNOSIS — M542 Cervicalgia: Secondary | ICD-10-CM | POA: Diagnosis not present

## 2020-10-06 DIAGNOSIS — R29898 Other symptoms and signs involving the musculoskeletal system: Secondary | ICD-10-CM | POA: Diagnosis not present

## 2020-10-06 DIAGNOSIS — R200 Anesthesia of skin: Secondary | ICD-10-CM | POA: Diagnosis not present

## 2020-10-06 NOTE — Progress Notes (Signed)
[facility]  PATIENT: Yvonne Campbell DOB: 1951-06-02  REFERRING CLINICIAN: Windell Hummingbird, PA-C HISTORY FROM: patient  REASON FOR VISIT: new consult    HISTORICAL  CHIEF COMPLAINT:  Chief Complaint  Patient presents with  . Left hand paresthesia    Rm 7 New Pt  "ambidextrous- numbness, tingling all the time in both hands, I drop things for no reason, right hand swells, no pain to talk about"     HISTORY OF PRESENT ILLNESS:   70 year old female here for evaluation of left hand weakness. Patient reports progressive left greater than right hand weakness, numbness and tingling.  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  She also has neck pain.  No problems with feet or legs.    REVIEW OF SYSTEMS: Full 14 system review of systems performed and negative with exception of: As per HPI.  ALLERGIES: Allergies  Allergen Reactions  . Antiseborrheic Products, Misc. Swelling  . Latex Rash  . Penicillins Swelling    Did it involve swelling of the face/tongue/throat, SOB, or low BP? Unknown Did it involve sudden or severe rash/hives, skin peeling, or any reaction on the inside of your mouth or nose? Unknown Did you need to seek medical attention at a hospital or doctor's office? Unknown When did it last happen?unk If all above answers are "NO", may proceed with cephalosporin use.   . Sulfa Antibiotics Itching and Rash    HOME MEDICATIONS: Outpatient Medications Prior to Visit  Medication Sig Dispense Refill  . aspirin EC 81 MG tablet Take 81 mg by mouth daily.    . Budeson-Glycopyrrol-Formoterol (BREZTRI AEROSPHERE) 160-9-4.8 MCG/ACT AERO Inhale 1 puff into the lungs daily as needed (SOB/ Wheezing).    . carvedilol (COREG) 25 MG tablet Take 25 mg by mouth 2 (two) times daily.    . cetirizine (ZYRTEC) 10 MG tablet Take 10 mg by mouth daily.    . cyclobenzaprine (FLEXERIL) 10 MG tablet Take 10 mg by mouth 3 (three) times daily as  needed for muscle spasms.     Marland Kitchen ENTRESTO 97-103 MG Take 1 tablet by mouth 2 (two) times a day.    . famotidine (PEPCID) 40 MG tablet Take 40 mg by mouth at bedtime.    . gabapentin (NEURONTIN) 300 MG capsule Take 600 mg by mouth 2 (two) times daily.    . hydrOXYzine (ATARAX/VISTARIL) 25 MG tablet Take 25 mg by mouth at bedtime.    . meclizine (ANTIVERT) 25 MG tablet Take 25 mg by mouth daily.    Marland Kitchen omeprazole (PRILOSEC) 40 MG capsule Take 40 mg by mouth daily as needed (acid reflux).    . predniSONE (STERAPRED UNI-PAK 21 TAB) 10 MG (21) TBPK tablet Take by mouth daily.    Marland Kitchen PROAIR HFA 108 (90 Base) MCG/ACT inhaler Inhale 2 puffs into the lungs every 6 (six) hours as needed for wheezing or shortness of breath.    . sodium chloride (OCEAN) 0.65 % nasal spray Place 2 sprays into the nose as needed for congestion.    Marland Kitchen venlafaxine (EFFEXOR) 75 MG tablet Take 75 mg by mouth daily.    Marland Kitchen HYDROcodone-acetaminophen (NORCO) 5-325 MG tablet Take 1-2 tablets by mouth every 4 (four) hours as needed for moderate pain or severe pain. (Patient not taking: Reported on 10/06/2020) 40 tablet 0  . hydrocortisone 2.5 % cream Apply 1 application topically 2 (two) times daily as needed (irritation). (Patient not taking: Reported on 10/06/2020)    .  tiZANidine (ZANAFLEX) 2 MG tablet Take 1 tablet (2 mg total) by mouth every 8 (eight) hours as needed for muscle spasms. (Patient not taking: Reported on 10/06/2020) 30 tablet 0  . traMADol (ULTRAM) 50 MG tablet Take 50 mg by mouth daily. (Patient not taking: Reported on 10/06/2020)    . albuterol (VENTOLIN HFA) 108 (90 Base) MCG/ACT inhaler Inhale 1-2 puffs into the lungs every 4 (four) hours as needed for wheezing or shortness of breath.     No facility-administered medications prior to visit.    PAST MEDICAL HISTORY: Past Medical History:  Diagnosis Date  . Acute bronchitis   . Anemia   . CHF (congestive heart failure) ([place])   . COPD (chronic obstructive pulmonary  disease) ([place])   . Diabetes mellitus without complication ([place])   . Drug use   . Epistaxis 11/22/2018  . GERD (gastroesophageal reflux disease)   . History of vitamin D deficiency   . Hyperlipidemia   . Hypertension   . Hypertensive heart disease with heart failure ([place]) 08/29/2018   Essential hypertension  . Impaired glucose tolerance   . Left ventricular dysfunction 08/29/2018   Left ventricular dysfunction  . Leukemia ([place])   . LVH (left ventricular hypertrophy)   . Menopause   . Paresthesia of hand    left  . Pneumonia of right lower lobe due to infectious organism 10/23/2018  . 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  . Primary osteoarthritis of right hip 05/01/2018  . Severe mitral regurgitation 08/29/2018   Mitral regurgitation  . Stroke ([place])   . Tobacco abuse 08/29/2018   Tobacco abuse    PAST SURGICAL HISTORY: Past Surgical History:  Procedure Laterality Date  . CARDIAC CATHETERIZATION    . CESAREAN SECTION    . CHOLECYSTECTOMY  2011  . REVERSE SHOULDER ARTHROPLASTY Right 09/05/2020   Procedure: REVERSE SHOULDER ARTHROPLASTY;  Surgeon: Netta Cedars, MD;  Location: [facility];  Service: Orthopedics;  Laterality: Right;  with interscalene block  . RIGHT/LEFT HEART CATH AND CORONARY ANGIOGRAPHY N/A 09/04/2018   Procedure: RIGHT/LEFT HEART CATH AND CORONARY ANGIOGRAPHY;  Surgeon: Lorretta Harp, MD;  Location: [facility];  Service: Cardiovascular;  Laterality: N/A;    FAMILY HISTORY: Family History  Problem Relation Age of Onset  . Hypertension Mother   . Breast cancer Mother   . Heart failure Mother   . Heart attack Paternal Grandfather   . Hypertension Paternal Grandfather   . Heart failure Father     SOCIAL HISTORY: Social History   Socioeconomic History  . Marital status: Single    Spouse name: Not on file  . Number of children: 2  . Years of education: Not on file  . Highest education level: Some college, no degree  Occupational History     Comment: retired  Tobacco Use  . Smoking status: Former Smoker    Packs/day: 0.50    Years: 35.00    Pack years: 17.50    Types: Cigarettes    Quit date: 10/2018    Years since quitting: 1.9  . Smokeless tobacco: Never Used  . Tobacco comment: 10/06/20 3-4 daily  Vaping Use  . Vaping Use: Never used  Substance and Sexual Activity  . Alcohol use: Not Currently    Comment: 10/06/20 none  . Drug use: Not Currently    Types: Marijuana    Comment: 10/06/20 none  . Sexual activity: Not on file  Other Topics Concern  . Not on file  Social History Narrative  Lives with daughter and family   caffeine Pepsi 8-9 cans daily   Social Determinants of Health   Financial Resource Strain: Not on file  Food Insecurity: Not on file  Transportation Needs: Not on file  Physical Activity: Not on file  Stress: Not on file  Social Connections: Not on file  Intimate Partner Violence: Not on file     PHYSICAL EXAM  GENERAL EXAM/CONSTITUTIONAL: Vitals:  Vitals:   10/06/20 0812  BP: 131/86  Pulse: 63  Weight: 140 lb (63.5 kg)  Height: 5\' 4"  (1.626 m)   Body mass index is 24.03 kg/m. Wt Readings from Last 3 Encounters:  10/06/20 140 lb (63.5 kg)  09/02/20 137 lb (62.1 kg)  08/19/20 128 lb 6.4 oz (58.2 kg)    Patient is in no distress; well developed, nourished and groomed; neck is supple  CARDIOVASCULAR:  Examination of carotid arteries is normal; no carotid bruits  Regular rate and rhythm, no murmurs  Examination of peripheral vascular system by observation and palpation is normal  EYES:  Ophthalmoscopic exam of optic discs and posterior segments is normal; no papilledema or hemorrhages No exam data present  MUSCULOSKELETAL:  Gait, strength, tone, movements noted in Neurologic exam below  NEUROLOGIC: MENTAL STATUS:  No flowsheet data found.  awake, alert, oriented to person, place and time  recent and remote memory intact  normal attention and  concentration  language fluent, comprehension intact, naming intact  fund of knowledge appropriate  CRANIAL NERVE:   2nd - no papilledema on fundoscopic exam  2nd, 3rd, 4th, 6th - pupils equal and reactive to light, visual fields full to confrontation, extraocular muscles intact, no nystagmus  5th - facial sensation symmetric  7th - facial strength symmetric  8th - hearing intact  9th - palate elevates symmetrically, uvula midline  11th - shoulder shrug symmetric  12th - tongue protrusion midline  MOTOR:   ATROPHY OF THENAR AND HYPOTHENAR MUSCLES (BILATERALLY)  BUE RIGHT DELTOID 4 (LIMITED BY PAIN; SURGERY IN FEB 2022); BICEPS 4, TRICEPS 4, RIGHT FINGER ABDUCTION 4, LEFT FINGER ABDUCTION 3, GRIP 3  normal bulk and tone, full strength in the BLE  SENSORY:   normal and symmetric to light touch, temperature, vibration; DECR PP IN FINGERS (LEFT WORSE THAN RIGHT)  COORDINATION:   finger-nose-finger, fine finger movements normal  REFLEXES:   deep tendon reflexes TRACE and symmetric  GAIT/STATION:   narrow based gait     DIAGNOSTIC DATA (LABS, IMAGING, TESTING) - I reviewed patient records, labs, notes, testing and imaging myself where available.  Lab Results  Component Value Date   WBC 5.9 09/02/2020   HGB 13.3 09/02/2020   HCT 41.1 09/02/2020   MCV 92.2 09/02/2020   PLT 267 09/02/2020      Component Value Date/Time   NA 143 09/02/2020 1050   NA 141 01/05/2019 1608   K 3.4 (L) 09/02/2020 1050   CL 107 09/02/2020 1050   CO2 26 09/02/2020 1050   GLUCOSE 117 (H) 09/02/2020 1050   BUN 10 09/02/2020 1050   BUN 18 01/05/2019 1608   CREATININE 1.02 (H) 09/02/2020 1050   CALCIUM 9.0 09/02/2020 1050   GFRNONAA 59 (L) 09/02/2020 1050   GFRAA 62 01/05/2019 1608   No results found for: CHOL, HDL, LDLCALC, LDLDIRECT, TRIG, CHOLHDL Lab Results  Component Value Date   HGBA1C 5.9 (H) 09/02/2020   No results found for: [id number] Lab Results  Component  Value Date   TSH 0.932 08/29/2018  ASSESSMENT AND PLAN  70 y.o. year old female here with bilateral hand numbness and weakness, atrophy, neck pain, since past few years.  We will proceed with further work-up with MRI cervical spine and EMG testing.  Dx:  1. Weakness of both hands   2. Bilateral hand numbness   3. Neck pain     PLAN:  NECK PAIN, HAND WEAKNESS, NUMBNESS (bilateral cervical radiculopathies vs bilateral peripheral neuropathies --> ulnar and median) - check MRI cervical, EMG/NCS  Orders Placed This Encounter  Procedures  . MR CERVICAL SPINE WO CONTRAST  . NCV with EMG(electromyography)   Return for for NCV/EMG.    Penni Bombard, MD 11/02/7351, 2:99 AM Certified in Neurology, Neurophysiology and Neuroimaging  [facility] [address], [place], [place] [address] [phone]

## 2020-10-06 NOTE — Patient Instructions (Signed)
  NECK PAIN, HAND WEAKNESS, NUMBNESS  - check MRI cervical, EMG/NCS (nerve testing)

## 2020-10-07 ENCOUNTER — Telehealth: Payer: Self-pay | Admitting: Diagnostic Neuroimaging

## 2020-10-07 NOTE — Telephone Encounter (Signed)
[employer] pending uploaded notes  

## 2020-10-07 NOTE — Telephone Encounter (Signed)
Yvonne Campbell Yvonne Campbell: [id number] (exp. 10/07/20 to 11/06/20) order sent to GI.. they will reach out to the patient to schedule.

## 2020-10-20 ENCOUNTER — Ambulatory Visit
Admission: RE | Admit: 2020-10-20 | Discharge: 2020-10-20 | Disposition: A | Discharge status: Home / Self Care | Payer: Medicare HMO | Source: Ambulatory Visit | Attending: Diagnostic Neuroimaging | Admitting: Diagnostic Neuroimaging

## 2020-10-20 ENCOUNTER — Other Ambulatory Visit: Payer: Self-pay

## 2020-10-20 DIAGNOSIS — R29898 Other symptoms and signs involving the musculoskeletal system: Secondary | ICD-10-CM | POA: Diagnosis not present

## 2020-10-20 DIAGNOSIS — R200 Anesthesia of skin: Secondary | ICD-10-CM | POA: Diagnosis not present

## 2020-10-20 DIAGNOSIS — M542 Cervicalgia: Secondary | ICD-10-CM | POA: Diagnosis not present

## 2020-10-22 ENCOUNTER — Telehealth: Payer: Self-pay | Admitting: *Deleted

## 2020-10-22 NOTE — Telephone Encounter (Signed)
Called patient and advised her of Dr Cathren Laine results. She had no questions,  verbalized understanding, appreciation.

## 2020-10-22 NOTE — Telephone Encounter (Signed)
MRI of the cervical spine showed multilevel arthritic degenerative changes however there does not appear to be significant findings (nerve pinching for example) at the levels that would account for patients hand pain. She has an emg/ncs study schedule with Dr. Leta Baptist in a week, he can review the MRI in more detail then and also the emg/ncs will shed more light on the causes.

## 2020-10-23 ENCOUNTER — Encounter: Payer: Medicare HMO | Admitting: Diagnostic Neuroimaging

## 2020-10-30 ENCOUNTER — Encounter: Payer: Medicare HMO | Admitting: Diagnostic Neuroimaging

## 2020-11-06 ENCOUNTER — Encounter (INDEPENDENT_AMBULATORY_CARE_PROVIDER_SITE_OTHER): Payer: Medicare HMO | Admitting: Diagnostic Neuroimaging

## 2020-11-06 ENCOUNTER — Other Ambulatory Visit: Payer: Self-pay

## 2020-11-06 ENCOUNTER — Ambulatory Visit (INDEPENDENT_AMBULATORY_CARE_PROVIDER_SITE_OTHER): Payer: Medicare HMO | Admitting: Diagnostic Neuroimaging

## 2020-11-06 DIAGNOSIS — R29898 Other symptoms and signs involving the musculoskeletal system: Secondary | ICD-10-CM

## 2020-11-06 DIAGNOSIS — M542 Cervicalgia: Secondary | ICD-10-CM | POA: Diagnosis not present

## 2020-11-06 DIAGNOSIS — Z0289 Encounter for other administrative examinations: Secondary | ICD-10-CM

## 2020-11-06 DIAGNOSIS — R200 Anesthesia of skin: Secondary | ICD-10-CM | POA: Diagnosis not present

## 2020-11-06 NOTE — Procedures (Signed)
   [facility]  NCS (NERVE CONDUCTION STUDY) WITH EMG (ELECTROMYOGRAPHY) REPORT   STUDY DATE: 11/06/20 PATIENT NAME: Yvonne Campbell DOB: April 05, 1951 MRN: [id number]  ORDERING CLINICIAN: Andrey Spearman, MD  TECHNOLOGIST: Sherre Scarlet ELECTROMYOGRAPHER: Earlean Polka. Jadesola Poynter, MD  CLINICAL INFORMATION: 70 year old female with neck pain and left hand numbness and weakness.   FINDINGS: NERVE CONDUCTION STUDY:  Bilateral median motor responses have prolonged distal latencies, normal amplitudes, normal conduction velocities.  Left ulnar motor response is normal.  Bilateral median sensory responses have prolonged peak latencies and normal amplitudes.  Left ulnar sensory response is normal.  Left ulnar F wave latency is normal.   NEEDLE ELECTROMYOGRAPHY:  Needle examination of left upper extremity is normal.   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    INTERPRETING PHYSICIAN:  Penni Bombard, MD Certified in Neurology, Neurophysiology and Neuroimaging  [facility] [address] [place], [place] [address] [phone]   [facility]    Nerve / Sites Muscle Latency Ref. Amplitude Ref. Rel Amp Segments Distance Velocity Ref. Area    ms ms mV mV %  cm m/s m/s mVms  L Median - APB     Wrist APB 5.9 ?4.4 6.2 ?4.0 100 Wrist - APB 7   24.6     Upper arm APB 10.4  5.9  95.8 Upper arm - Wrist 22 49 ?49 23.9  R Median - APB     Wrist APB 4.7 ?4.4 5.0 ?4.0 100 Wrist - APB 7   16.0     Upper arm APB 8.7  4.5  90.2 Upper arm - Wrist 20 51 ?49 19.8  L Ulnar - ADM     Wrist ADM 3.0 ?3.3 9.9 ?6.0 100 Wrist - ADM 7   34.4     B.Elbow ADM 6.5  8.8  89 B.Elbow - Wrist 18 51 ?49 29.0     A.Elbow ADM 8.5  8.8  99.3 A.Elbow - B.Elbow 10 50 ?49 29.6           SNC    Nerve / Sites Rec. Site Peak Lat Ref.  Amp Ref. Segments Distance    ms ms V V   cm  L Median - Orthodromic (Dig II, Mid palm)     Dig II Wrist 4.8 ?3.4 12 ?10 Dig II - Wrist 13  R Median - Orthodromic (Dig II, Mid palm)     Dig II Wrist 3.6 ?3.4 12 ?10 Dig II - Wrist 13  L Ulnar - Orthodromic, (Dig V, Mid palm)     Dig V Wrist 2.9 ?3.1 7 ?5 Dig V - Wrist 25           F  Wave    Nerve F Lat Ref.   ms ms  L Ulnar - ADM 31.6 ?32.0       EMG Summary Table    Spontaneous MUAP Recruitment  Muscle IA Fib PSW Fasc Other Amp Dur. Poly Pattern  L. Deltoid Normal None None None _______ Normal Normal Normal Normal  L. Biceps brachii Normal None None None _______ Normal Normal Normal Normal  L. Triceps brachii Normal None None None _______ Normal Normal Normal Normal  L. Flexor carpi radialis Normal None None None _______ Normal Normal Normal Normal  L. First dorsal interosseous Normal None None None _______ Normal Normal Normal Normal

## 2021-08-04 NOTE — Progress Notes (Addendum)
COVID swab appointment:08-21-21 @ 9:15 AM  COVID Vaccine Completed: Yes x2 Date COVID Vaccine completed: Has received booster: COVID vaccine manufacturer:  Moderna     Date of COVID positive in last 90 days:  No  PCP - Windell Hummingbird, PA-C (note requested)  Cardiologist - Lawson Radar, MD currently, (notes requested).  Saw Shirlee More, MD in the past  Cardiac clearance on chart from Lawson Radar, MD dated 07-21-21  Chest x-ray - 05-07-21 [facility] EKG - 10-24-20 [facility],  06-24-21 On chart Stress Test - 02-28-20 [facility] ECHO - 10-25-20 [facility] Cardiac Cath - greater than 2 years Pacemaker/ICD device last checked: Spinal Cord Stimulator:  Sleep Study - No CPAP -   Fasting Blood Sugar -  Checks Blood Sugar - does not check  Blood Thinner Instructions:  N/A Aspirin Instructions: Last Dose:  Activity level:  Can go up a flight of stairs and perform activities of daily living without stopping and without symptoms of chest pain or shortness of breath.    Anesthesia review:  L ventricular dysfunction, severe mitral regurg, HTN, DM, CHF, COPD, LVH, hx of stroke  Patient denies shortness of breath, fever, cough and chest pain at PAT appointment   Patient verbalized understanding of instructions that were given to them at the PAT appointment. Patient was also instructed that they will need to review over the PAT instructions again at home before surgery.

## 2021-08-04 NOTE — Patient Instructions (Addendum)
DUE TO COVID-19 ONLY ONE VISITOR IS ALLOWED TO COME WITH YOU AND STAY IN THE WAITING ROOM ONLY DURING PRE OP AND PROCEDURE.   **NO VISITORS ARE ALLOWED IN THE SHORT STAY AREA OR RECOVERY ROOM!!**  IF YOU WILL BE ADMITTED INTO THE HOSPITAL YOU ARE ALLOWED ONLY TWO SUPPORT PEOPLE DURING VISITATION HOURS ONLY (7 AM -8PM)    Up to two visitors ages 69+ are allowed at one time in a patient's room.  The visitors may rotate out with other people throughout the day.  Additionally, up to two children between the ages of 43 and 50 are allowed and do not count toward the number of allowed visitors.  Children within this age range must be accompanied by an adult visitor.  One adult visitor may remain with the patient overnight and must be in the room by 8 PM.  COVID SWAB TESTING MUST BE COMPLETED ON:  08-21-21 @ 9:15 AM  COME IN THROUGH MAIN ENTRANCE of [facility].  Take a seat in the lobby area to the right as you come in the main entrance.  Call [phone]  and give your name and let them know you are here for COVID testing  You are not required to quarantine, however you are required to wear a well-fitted mask when you are out and around people not in your household.  Hand Hygiene often Do NOT share personal items Notify your provider if you are in close contact with someone who has COVID or you develop fever 100.4 or greater, new onset of sneezing, cough, sore throat, shortness of breath or body aches.        Your procedure is scheduled on:  Tuesday, 08-25-21   Report to [facility] Main  Entrance     Report to admitting at 8:45 AM   Call this number if you have problems the morning of surgery [phone]   Do not eat food :After Midnight.   May have liquids until 8:30 AM day of surgery  CLEAR LIQUID DIET  Foods Allowed                                                                     Foods Excluded  Water, Black Coffee (no milk/no creamer) and tea, regular and decaf                               liquids that you cannot  Plain Jell-O in any flavor  (No red)                         see through such as: Fruit ices (not with fruit pulp)                                 milk, soups, orange juice  Iced Popsicles (No red)                                    All solid food  Apple juices Sports drinks like Gatorade (No red) Lightly seasoned clear broth or consume(fat free) Sugar   Complete one G2 drink the morning of surgery at  8:30 AM the day of surgery.     The day of surgery:  Drink ONE (1) Pre-Surgery G2 the morning of surgery. Drink in one sitting. Do not sip.  This drink was given to you during your hospital  pre-op appointment visit. Nothing else to drink after completing the Pre-Surgery G2.          If you have questions, please contact your surgeons office.     Oral Hygiene is also important to reduce your risk of infection.                                    Remember - BRUSH YOUR TEETH THE MORNING OF SURGERY WITH YOUR REGULAR TOOTHPASTE   Do NOT smoke after Midnight   Take these medicines the morning of surgery with A SIP OF WATER:  Carvedilol, Gabapentin, Hydroxyzine, Omeprazole, Effexor  Stop all vitamins and herbal supplements a week before surgery             You may not have any metal on your body including hair pins, jewelry, and body piercing             Do not wear make-up, lotions, powders, perfumes/ or deodorant  Do not wear nail polish including gel and S&S, artificial/acrylic nails, or any other type of covering on natural nails including finger and toenails. If you have artificial nails, gel coating, etc. that needs to be removed by a nail salon please have this removed prior to surgery or surgery may need to be canceled/ delayed if the surgeon/ anesthesia feels like they are unable to be safely monitored.   Do not shave  48 hours prior to surgery.   Do not bring valuables to the hospital. [place].   Contacts, dentures or bridgework may not be worn into surgery.   Bring small overnight bag day of surgery.  Please read over the following fact sheets you were given: IF YOU HAVE QUESTIONS ABOUT YOUR PRE OP INSTRUCTIONS PLEASE CALL [facility] - Preparing for Surgery Before surgery, you can play an important role.  Because skin is not sterile, your skin needs to be as free of germs as possible.  You can reduce the number of germs on your skin by washing with CHG (chlorahexidine gluconate) soap before surgery.  CHG is an antiseptic cleaner which kills germs and bonds with the skin to continue killing germs even after washing. Please DO NOT use if you have an allergy to CHG or antibacterial soaps.  If your skin becomes reddened/irritated stop using the CHG and inform your nurse when you arrive at Short Stay. Do not shave (including legs and underarms) for at least 48 hours prior to the first CHG shower.  You may shave your face/neck.  Please follow these instructions carefully:  1.  Shower with CHG Soap the night before surgery and the  morning of surgery.  2.  If you choose to wash your hair, wash your hair first as usual with your normal  shampoo.  3.  After you shampoo, rinse your hair and body thoroughly to remove the shampoo.  4.  Use CHG as you would any other liquid soap.  You can apply chg directly to the skin and wash.  Gently with a scrungie or clean washcloth.  5.  Apply the CHG Soap to your body ONLY FROM THE NECK DOWN.   Do   not use on face/ open                           Wound or open sores. Avoid contact with eyes, ears mouth and   genitals (private parts).                       Wash face,  Genitals (private parts) with your normal soap.             6.  Wash thoroughly, paying special attention to the area where your    surgery  will be performed.  7.  Thoroughly rinse your body with warm water from the  neck down.  8.  DO NOT shower/wash with your normal soap after using and rinsing off the CHG Soap.                9.  Pat yourself dry with a clean towel.            10.  Wear clean pajamas.            11.  Place clean sheets on your bed the night of your first shower and do not  sleep with pets. Day of Surgery : Do not apply any lotions/deodorants the morning of surgery.  Please wear clean clothes to the hospital/surgery center.  FAILURE TO FOLLOW THESE INSTRUCTIONS MAY RESULT IN THE CANCELLATION OF YOUR SURGERY  PATIENT SIGNATURE_________________________________  NURSE SIGNATURE__________________________________  ________________________________________________________________________   Yvonne Campbell  An incentive spirometer is a tool that can help keep your lungs clear and active. This tool measures how well you are filling your lungs with each breath. Taking long deep breaths may help reverse or decrease the chance of developing breathing (pulmonary) problems (especially infection) following: A long period of time when you are unable to move or be active. BEFORE THE PROCEDURE  If the spirometer includes an indicator to show your best effort, your nurse or respiratory therapist will set it to a desired goal. If possible, sit up straight or lean slightly forward. Try not to slouch. Hold the incentive spirometer in an upright position. INSTRUCTIONS FOR USE  Sit on the edge of your bed if possible, or sit up as far as you can in bed or on a chair. Hold the incentive spirometer in an upright position. Breathe out normally. Place the mouthpiece in your mouth and seal your lips tightly around it. Breathe in slowly and as deeply as possible, raising the piston or the ball toward the top of the column. Hold your breath for 3-5 seconds or for as long as possible. Allow the piston or ball to fall to the bottom of the column. Remove the mouthpiece from your mouth and breathe out  normally. Rest for a few seconds and repeat Steps 1 through 7 at least 10 times every 1-2 hours when you are awake. Take your time and take a few normal breaths between deep breaths. The spirometer may include an indicator to show your best effort. Use the indicator as a goal to work toward during each repetition. After each set of 10 deep breaths, practice coughing to be sure  your lungs are clear. If you have an incision (the cut made at the time of surgery), support your incision when coughing by placing a pillow or rolled up towels firmly against it. Once you are able to get out of bed, walk around indoors and cough well. You may stop using the incentive spirometer when instructed by your caregiver.  RISKS AND COMPLICATIONS Take your time so you do not get dizzy or light-headed. If you are in pain, you may need to take or ask for pain medication before doing incentive spirometry. It is harder to take a deep breath if you are having pain. AFTER USE Rest and breathe slowly and easily. It can be helpful to keep track of a log of your progress. Your caregiver can provide you with a simple table to help with this. If you are using the spirometer at home, follow these instructions: [place] IF:  You are having difficultly using the spirometer. You have trouble using the spirometer as often as instructed. Your pain medication is not giving enough relief while using the spirometer. You develop fever of 100.5 F (38.1 C) or higher. SEEK IMMEDIATE MEDICAL CARE IF:  You cough up bloody sputum that had not been present before. You develop fever of 102 F (38.9 C) or greater. You develop worsening pain at or near the incision site. MAKE SURE YOU:  Understand these instructions. Will watch your condition. Will get help right away if you are not doing well or get worse. Document Released: 11/22/2006 Document Revised: 10/04/2011 Document Reviewed: 01/23/2007 ExitCare Patient Information  2014 ExitCare, [place].   ________________________________________________________________________  WHAT IS A BLOOD TRANSFUSION? Blood Transfusion Information  A transfusion is the replacement of blood or some of its parts. Blood is made up of multiple cells which provide different functions. Red blood cells carry oxygen and are used for blood loss replacement. White blood cells fight against infection. Platelets control bleeding. Plasma helps clot blood. Other blood products are available for specialized needs, such as hemophilia or other clotting disorders. BEFORE THE TRANSFUSION  Who gives blood for transfusions?  Healthy volunteers who are fully evaluated to make sure their blood is safe. This is blood bank blood. Transfusion therapy is the safest it has ever been in the practice of medicine. Before blood is taken from a donor, a complete history is taken to make sure that person has no history of diseases nor engages in risky social behavior (examples are intravenous drug use or sexual activity with multiple partners). The donor's travel history is screened to minimize risk of transmitting infections, such as malaria. The donated blood is tested for signs of infectious diseases, such as HIV and hepatitis. The blood is then tested to be sure it is compatible with you in order to minimize the chance of a transfusion reaction. If you or a relative donates blood, this is often done in anticipation of surgery and is not appropriate for emergency situations. It takes many days to process the donated blood. RISKS AND COMPLICATIONS Although transfusion therapy is very safe and saves many lives, the main dangers of transfusion include:  Getting an infectious disease. Developing a transfusion reaction. This is an allergic reaction to something in the blood you were given. Every precaution is taken to prevent this. The decision to have a blood transfusion has been considered carefully by your caregiver  before blood is given. Blood is not given unless the benefits outweigh the risks. AFTER THE TRANSFUSION Right after receiving a blood  transfusion, you will usually feel much better and more energetic. This is especially true if your red blood cells have gotten low (anemic). The transfusion raises the level of the red blood cells which carry oxygen, and this usually causes an energy increase. The nurse administering the transfusion will monitor you carefully for complications. HOME CARE INSTRUCTIONS  No special instructions are needed after a transfusion. You may find your energy is better. Speak with your caregiver about any limitations on activity for underlying diseases you may have. SEEK MEDICAL CARE IF:  Your condition is not improving after your transfusion. You develop redness or irritation at the intravenous (IV) site. SEEK IMMEDIATE MEDICAL CARE IF:  Any of the following symptoms occur over the next 12 hours: Shaking chills. You have a temperature by mouth above 102 F (38.9 C), not controlled by medicine. Chest, back, or muscle pain. People around you feel you are not acting correctly or are confused. Shortness of breath or difficulty breathing. Dizziness and fainting. You get a rash or develop hives. You have a decrease in urine output. Your urine turns a dark color or changes to pink, red, or brown. Any of the following symptoms occur over the next 10 days: You have a temperature by mouth above 102 F (38.9 C), not controlled by medicine. Shortness of breath. Weakness after normal activity. The white part of the eye turns yellow (jaundice). You have a decrease in the amount of urine or are urinating less often. Your urine turns a dark color or changes to pink, red, or brown. Document Released: 07/09/2000 Document Revised: 10/04/2011 Document Reviewed: 02/26/2008 [facility] Patient Information 2014 [place],  [place].  _______________________________________________________________________

## 2021-08-05 ENCOUNTER — Other Ambulatory Visit: Payer: Self-pay

## 2021-08-05 ENCOUNTER — Encounter (HOSPITAL_COMMUNITY): Payer: Self-pay

## 2021-08-05 ENCOUNTER — Encounter (HOSPITAL_COMMUNITY)
Admission: RE | Admit: 2021-08-05 | Discharge: 2021-08-05 | Disposition: A | Discharge status: Home / Self Care | Payer: Medicare Other | Source: Ambulatory Visit | Attending: Orthopedic Surgery | Admitting: Orthopedic Surgery

## 2021-08-05 VITALS — BP 126/68 | HR 66 | Temp 98.5°F | Resp 20 | Ht 63.0 in | Wt 154.0 lb

## 2021-08-05 DIAGNOSIS — I110 Hypertensive heart disease with heart failure: Secondary | ICD-10-CM | POA: Insufficient documentation

## 2021-08-05 DIAGNOSIS — Z01818 Encounter for other preprocedural examination: Secondary | ICD-10-CM

## 2021-08-05 DIAGNOSIS — E119 Type 2 diabetes mellitus without complications: Secondary | ICD-10-CM | POA: Insufficient documentation

## 2021-08-05 DIAGNOSIS — M1611 Unilateral primary osteoarthritis, right hip: Secondary | ICD-10-CM | POA: Diagnosis not present

## 2021-08-05 DIAGNOSIS — J449 Chronic obstructive pulmonary disease, unspecified: Secondary | ICD-10-CM | POA: Diagnosis not present

## 2021-08-05 DIAGNOSIS — Z8673 Personal history of transient ischemic attack (TIA), and cerebral infarction without residual deficits: Secondary | ICD-10-CM | POA: Diagnosis not present

## 2021-08-05 DIAGNOSIS — I340 Nonrheumatic mitral (valve) insufficiency: Secondary | ICD-10-CM | POA: Insufficient documentation

## 2021-08-05 DIAGNOSIS — I509 Heart failure, unspecified: Secondary | ICD-10-CM | POA: Diagnosis not present

## 2021-08-05 DIAGNOSIS — F17210 Nicotine dependence, cigarettes, uncomplicated: Secondary | ICD-10-CM | POA: Insufficient documentation

## 2021-08-05 DIAGNOSIS — Z01812 Encounter for preprocedural laboratory examination: Secondary | ICD-10-CM | POA: Diagnosis present

## 2021-08-05 HISTORY — DX: Anxiety disorder, unspecified: F41.9

## 2021-08-05 HISTORY — DX: Prediabetes: R73.03

## 2021-08-05 LAB — CBC
HCT: 38.9 % (ref 36.0–46.0)
Hemoglobin: 12.6 g/dL (ref 12.0–15.0)
MCH: 29.6 pg (ref 26.0–34.0)
MCHC: 32.4 g/dL (ref 30.0–36.0)
MCV: 91.5 fL (ref 80.0–100.0)
Platelets: 245 10*3/uL (ref 150–400)
RBC: 4.25 MIL/uL (ref 3.87–5.11)
RDW: 14.1 % (ref 11.5–15.5)
WBC: 5.3 10*3/uL (ref 4.0–10.5)
nRBC: 0 % (ref 0.0–0.2)

## 2021-08-05 LAB — COMPREHENSIVE METABOLIC PANEL
ALT: 12 U/L (ref 0–44)
AST: 15 U/L (ref 15–41)
Albumin: 3.3 g/dL — ABNORMAL LOW (ref 3.5–5.0)
Alkaline Phosphatase: 79 U/L (ref 38–126)
Anion gap: 8 (ref 5–15)
BUN: 14 mg/dL (ref 8–23)
CO2: 27 mmol/L (ref 22–32)
Calcium: 8.7 mg/dL — ABNORMAL LOW (ref 8.9–10.3)
Chloride: 103 mmol/L (ref 98–111)
Creatinine, Ser: 1.09 mg/dL — ABNORMAL HIGH (ref 0.44–1.00)
GFR, Estimated: 54 mL/min — ABNORMAL LOW (ref >60)
Glucose, Bld: 106 mg/dL — ABNORMAL HIGH (ref 70–99)
Potassium: 3.8 mmol/L (ref 3.5–5.1)
Sodium: 138 mmol/L (ref 135–145)
Total Bilirubin: 0.7 mg/dL (ref 0.3–1.2)
Total Protein: 7.5 g/dL (ref 6.5–8.1)

## 2021-08-05 LAB — HEMOGLOBIN A1C
Hgb A1c MFr Bld: 5.9 % — ABNORMAL HIGH (ref 4.8–5.6)
Mean Plasma Glucose: 122.63 mg/dL

## 2021-08-05 LAB — SURGICAL PCR SCREEN
MRSA, PCR: NEGATIVE
Staphylococcus aureus: NEGATIVE

## 2021-08-05 LAB — GLUCOSE, CAPILLARY: Glucose-Capillary: 103 mg/dL — ABNORMAL HIGH (ref 70–99)

## 2021-08-14 NOTE — Progress Notes (Signed)
Anesthesia Chart Review   Case: [id number] Date/Time: 08/25/21 1115   Procedure: TOTAL HIP ARTHROPLASTY ANTERIOR APPROACH (Right: Hip)   Anesthesia type: Spinal   Pre-op diagnosis: Right hip osteoarthritis   Location: [facility] ROOM 10 / [facility]   Surgeons: Paralee Cancel, MD       DISCUSSION:71 y.o. smoker with h/o HTN, COPD, stroke, CHF (EF 35-40%), mild to moderate mitral regurgitation, right hip OA scheduled for above procedure 08/25/2021 with Dr. Paralee Cancel.   Clearance from cardiology on chart which states pt is moderate risk for surgical procedure due to mild LVH and h/o low EF.   Anticipate pt can proceed with planned procedure barring acute status change.   VS: BP 126/68    Pulse 66    Temp 36.9 C (Oral)    Resp 20    Ht 5\' 3"  (1.6 m)    Wt 69.9 kg    SpO2 98%    BMI 27.28 kg/m   PROVIDERS: Windell Hummingbird, PA-C is PCP   Lawson Radar, MD is Cardiologist  LABS: Labs reviewed: Acceptable for surgery. (all labs ordered are listed, but only abnormal results are displayed)  Labs Reviewed  COMPREHENSIVE METABOLIC PANEL - Abnormal; Notable for the following components:      Result Value   Glucose, Bld 106 (*)    Creatinine, Ser 1.09 (*)    Calcium 8.7 (*)    Albumin 3.3 (*)    GFR, Estimated 54 (*)    All other components within normal limits  HEMOGLOBIN A1C - Abnormal; Notable for the following components:   Hgb A1c MFr Bld 5.9 (*)    All other components within normal limits  GLUCOSE, CAPILLARY - Abnormal; Notable for the following components:   Glucose-Capillary 103 (*)    All other components within normal limits  SURGICAL PCR SCREEN  CBC     IMAGES:   EKG: On chart   CV: Echo 10/25/2020 SUMMARY  NPS.  The left ventricular size is normal.  There is mild concentric left ventricular hypertrophy.  LV ejection fraction = 35-40%.  There is moderate global hypokinesis of the left ventricle.  There is basal LV inferior wall akinesis  The left atrium is mildly  dilated.  There is mild to moderate mitral regurgitation.  There is basal LV septal wall hypokinesis    Echo 04/11/2020 The left ventricle size is normal  There is mild concentric left ventricular hypertrophy Overall left ventricular systolic function is milldy impaired with an EF between 45-50%.  The left atrium is mildly dilated The right ventricle is normal in size and function The right atrium is normal in size and function  There is trace mitral regurgitation Normal valve anatomy and function; no pericardial effusion or intracardiac mass. No intracardiac shunt by 2-dimensional and color flow imaging. Normal thoracic aorta and aortic arch.  Past Medical History:  Diagnosis Date   Acute bronchitis    Anemia    Anxiety    CHF (congestive heart failure) (HCC)    COPD (chronic obstructive pulmonary disease) ([place])    Drug use    Epistaxis 11/22/2018   GERD (gastroesophageal reflux disease)    History of vitamin D deficiency    Hyperlipidemia    Hypertension    Hypertensive heart disease with heart failure ([place]) 08/29/2018   Essential hypertension   Impaired glucose tolerance    Left ventricular dysfunction 08/29/2018   Left ventricular dysfunction   Leukemia (HCC)    LVH (left ventricular hypertrophy)  Menopause    Paresthesia of hand    left   Pneumonia of right lower lobe due to infectious organism 10/23/2018   Pre-diabetes    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   Primary osteoarthritis of right hip 05/01/2018   Severe mitral regurgitation 08/29/2018   Mitral regurgitation   Stroke [facility])    Tobacco abuse 08/29/2018   Tobacco abuse    Past Surgical History:  Procedure Laterality Date   CARDIAC CATHETERIZATION     CESAREAN SECTION     CHOLECYSTECTOMY  2011   REVERSE SHOULDER ARTHROPLASTY Right 09/05/2020   Procedure: REVERSE SHOULDER ARTHROPLASTY;  Surgeon: Netta Cedars, MD;  Location: [facility];  Service: Orthopedics;  Laterality: Right;  with  interscalene block   RIGHT/LEFT HEART CATH AND CORONARY ANGIOGRAPHY N/A 09/04/2018   Procedure: RIGHT/LEFT HEART CATH AND CORONARY ANGIOGRAPHY;  Surgeon: Lorretta Harp, MD;  Location: [place] CV LAB;  Service: Cardiovascular;  Laterality: N/A;    MEDICATIONS:  carvedilol (COREG) 25 MG tablet   ENTRESTO 97-103 MG   fluticasone furoate-vilanterol (BREO ELLIPTA) 100-25 MCG/ACT AEPB   gabapentin (NEURONTIN) 300 MG capsule   HYDROcodone-acetaminophen (NORCO) 5-325 MG tablet   hydrOXYzine (ATARAX/VISTARIL) 25 MG tablet   meclizine (ANTIVERT) 25 MG tablet   omeprazole (PRILOSEC) 40 MG capsule   PROAIR HFA 108 (90 Base) MCG/ACT inhaler   sodium chloride (OCEAN) 0.65 % nasal spray   tiZANidine (ZANAFLEX) 2 MG tablet   traMADol (ULTRAM) 50 MG tablet   venlafaxine (EFFEXOR) 75 MG tablet   No current facility-administered medications for this encounter.     Konrad Felix Ward, PA-C [facility] Testing [phone]

## 2021-08-14 NOTE — Anesthesia Preprocedure Evaluation (Addendum)
Anesthesia Evaluation  Patient identified by MRN, date of birth, ID band Patient awake    Reviewed: Allergy & Precautions, H&P , NPO status , Patient's Chart, lab work & pertinent test results, reviewed documented beta blocker date and time   Airway Mallampati: II  TM Distance: >3 FB Neck ROM: Full    Dental no notable dental hx. (+) Edentulous Upper, Edentulous Lower, Dental Advisory Given   Pulmonary COPD,  COPD inhaler, Current Smoker and Patient abstained from smoking.,    Pulmonary exam normal breath sounds clear to auscultation       Cardiovascular Exercise Tolerance: Good hypertension, Pt. on medications and Pt. on home beta blockers +CHF  + Valvular Problems/Murmurs MR  Rhythm:Regular Rate:Normal     Neuro/Psych Anxiety CVA    GI/Hepatic Neg liver ROS, GERD  Medicated,  Endo/Other  diabetes  Renal/GU negative Renal ROS  negative genitourinary   Musculoskeletal  (+) Arthritis , Osteoarthritis,    Abdominal   Peds  Hematology  (+) Blood dyscrasia, anemia ,   Anesthesia Other Findings   Reproductive/Obstetrics negative OB ROS                          Anesthesia Physical Anesthesia Plan  ASA: 3  Anesthesia Plan: Spinal   Post-op Pain Management: Tylenol PO (pre-op)   Induction: Intravenous  PONV Risk Score and Plan: 2 and Propofol infusion and Ondansetron  Airway Management Planned: Natural Airway and Simple Face Mask  Additional Equipment:   Intra-op Plan:   Post-operative Plan:   Informed Consent: I have reviewed the patients History and Physical, chart, labs and discussed the procedure including the risks, benefits and alternatives for the proposed anesthesia with the patient or authorized representative who has indicated his/her understanding and acceptance.     Dental advisory given  Plan Discussed with: CRNA  Anesthesia Plan Comments: (See PAT note 08/05/21,  Konrad Felix Ward, PA-C)     Anesthesia Quick Evaluation

## 2021-08-21 ENCOUNTER — Encounter (HOSPITAL_COMMUNITY)
Admission: RE | Admit: 2021-08-21 | Discharge: 2021-08-21 | Disposition: A | Discharge status: Home / Self Care | Payer: Medicare Other | Source: Ambulatory Visit | Attending: Orthopedic Surgery | Admitting: Orthopedic Surgery

## 2021-08-21 ENCOUNTER — Other Ambulatory Visit: Payer: Self-pay

## 2021-08-21 DIAGNOSIS — Z01812 Encounter for preprocedural laboratory examination: Secondary | ICD-10-CM | POA: Insufficient documentation

## 2021-08-21 DIAGNOSIS — Z20822 Contact with and (suspected) exposure to covid-19: Secondary | ICD-10-CM | POA: Insufficient documentation

## 2021-08-21 DIAGNOSIS — Z01818 Encounter for other preprocedural examination: Secondary | ICD-10-CM

## 2021-08-21 LAB — SARS CORONAVIRUS 2 (TAT 6-24 HRS): SARS Coronavirus 2: NEGATIVE

## 2021-08-24 ENCOUNTER — Encounter (HOSPITAL_COMMUNITY): Payer: Self-pay | Admitting: Orthopedic Surgery

## 2021-08-25 ENCOUNTER — Ambulatory Visit (HOSPITAL_COMMUNITY): Payer: Medicare Other | Admitting: Anesthesiology

## 2021-08-25 ENCOUNTER — Observation Stay (HOSPITAL_COMMUNITY): Payer: Medicare Other

## 2021-08-25 ENCOUNTER — Inpatient Hospital Stay (HOSPITAL_COMMUNITY)
Admission: AD | Admit: 2021-08-25 | Discharge: 2021-08-27 | DRG: 470 | Disposition: A | Discharge status: Home / Self Care | Payer: Medicare Other | Attending: Orthopedic Surgery | Admitting: Orthopedic Surgery

## 2021-08-25 ENCOUNTER — Ambulatory Visit (HOSPITAL_COMMUNITY): Payer: Medicare Other

## 2021-08-25 ENCOUNTER — Other Ambulatory Visit: Payer: Self-pay

## 2021-08-25 ENCOUNTER — Encounter (HOSPITAL_COMMUNITY)
Admission: AD | Disposition: A | Discharge status: Home / Self Care | Payer: Self-pay | Source: Home / Self Care | Attending: Orthopedic Surgery

## 2021-08-25 ENCOUNTER — Encounter (HOSPITAL_COMMUNITY): Payer: Self-pay | Admitting: Orthopedic Surgery

## 2021-08-25 ENCOUNTER — Ambulatory Visit (HOSPITAL_COMMUNITY): Payer: Medicare Other | Admitting: Physician Assistant

## 2021-08-25 DIAGNOSIS — F17210 Nicotine dependence, cigarettes, uncomplicated: Secondary | ICD-10-CM | POA: Diagnosis present

## 2021-08-25 DIAGNOSIS — I340 Nonrheumatic mitral (valve) insufficiency: Secondary | ICD-10-CM | POA: Diagnosis present

## 2021-08-25 DIAGNOSIS — Z803 Family history of malignant neoplasm of breast: Secondary | ICD-10-CM

## 2021-08-25 DIAGNOSIS — Z9049 Acquired absence of other specified parts of digestive tract: Secondary | ICD-10-CM

## 2021-08-25 DIAGNOSIS — I5022 Chronic systolic (congestive) heart failure: Secondary | ICD-10-CM | POA: Diagnosis present

## 2021-08-25 DIAGNOSIS — Z8249 Family history of ischemic heart disease and other diseases of the circulatory system: Secondary | ICD-10-CM

## 2021-08-25 DIAGNOSIS — F419 Anxiety disorder, unspecified: Secondary | ICD-10-CM | POA: Diagnosis present

## 2021-08-25 DIAGNOSIS — M1611 Unilateral primary osteoarthritis, right hip: Principal | ICD-10-CM | POA: Diagnosis present

## 2021-08-25 DIAGNOSIS — E119 Type 2 diabetes mellitus without complications: Secondary | ICD-10-CM | POA: Diagnosis present

## 2021-08-25 DIAGNOSIS — Z96611 Presence of right artificial shoulder joint: Secondary | ICD-10-CM | POA: Diagnosis present

## 2021-08-25 DIAGNOSIS — Z91040 Latex allergy status: Secondary | ICD-10-CM

## 2021-08-25 DIAGNOSIS — Z96641 Presence of right artificial hip joint: Secondary | ICD-10-CM

## 2021-08-25 DIAGNOSIS — Z882 Allergy status to sulfonamides status: Secondary | ICD-10-CM

## 2021-08-25 DIAGNOSIS — R0600 Dyspnea, unspecified: Secondary | ICD-10-CM

## 2021-08-25 DIAGNOSIS — I110 Hypertensive heart disease with heart failure: Secondary | ICD-10-CM | POA: Diagnosis present

## 2021-08-25 DIAGNOSIS — Z880 Allergy status to penicillin: Secondary | ICD-10-CM

## 2021-08-25 DIAGNOSIS — F32A Depression, unspecified: Secondary | ICD-10-CM | POA: Diagnosis present

## 2021-08-25 DIAGNOSIS — Z79899 Other long term (current) drug therapy: Secondary | ICD-10-CM

## 2021-08-25 DIAGNOSIS — J449 Chronic obstructive pulmonary disease, unspecified: Secondary | ICD-10-CM | POA: Diagnosis present

## 2021-08-25 DIAGNOSIS — Z856 Personal history of leukemia: Secondary | ICD-10-CM

## 2021-08-25 DIAGNOSIS — Z8673 Personal history of transient ischemic attack (TIA), and cerebral infarction without residual deficits: Secondary | ICD-10-CM

## 2021-08-25 DIAGNOSIS — R0902 Hypoxemia: Secondary | ICD-10-CM | POA: Diagnosis not present

## 2021-08-25 DIAGNOSIS — Z96649 Presence of unspecified artificial hip joint: Secondary | ICD-10-CM

## 2021-08-25 DIAGNOSIS — Z7951 Long term (current) use of inhaled steroids: Secondary | ICD-10-CM

## 2021-08-25 HISTORY — PX: TOTAL HIP ARTHROPLASTY: SHX124

## 2021-08-25 LAB — TYPE AND SCREEN
ABO/RH(D): O POS
Antibody Screen: NEGATIVE

## 2021-08-25 LAB — ABO/RH: ABO/RH(D): O POS

## 2021-08-25 SURGERY — ARTHROPLASTY, HIP, TOTAL, ANTERIOR APPROACH
Anesthesia: Spinal | Site: Hip | Laterality: Right

## 2021-08-25 MED ORDER — POVIDONE-IODINE 10 % EX SWAB
2.0000 "application " | Freq: Once | CUTANEOUS | Status: AC
  Administered 2021-08-25: 2 via TOPICAL

## 2021-08-25 MED ORDER — TRANEXAMIC ACID-NACL 1000-0.7 MG/100ML-% IV SOLN
1000.0000 mg | Freq: Once | INTRAVENOUS | Status: AC
  Administered 2021-08-25: 1000 mg via INTRAVENOUS
  Filled 2021-08-25: qty 100

## 2021-08-25 MED ORDER — BISACODYL 10 MG RE SUPP: 10.0000 mg | Freq: Every day | RECTAL | Status: DC | PRN

## 2021-08-25 MED ORDER — SODIUM CHLORIDE 0.9 % IR SOLN
Status: DC | PRN
  Administered 2021-08-25: 1000 mL

## 2021-08-25 MED ORDER — METOCLOPRAMIDE HCL 5 MG/ML IJ SOLN: 5.0000 mg | Freq: Three times a day (TID) | INTRAMUSCULAR | Status: DC | PRN

## 2021-08-25 MED ORDER — ALBUTEROL SULFATE (2.5 MG/3ML) 0.083% IN NEBU
3.0000 mL | INHALATION_SOLUTION | Freq: Four times a day (QID) | RESPIRATORY_TRACT | Status: DC | PRN
  Administered 2021-08-26: 3 mL via RESPIRATORY_TRACT
  Filled 2021-08-25 (×2): qty 3

## 2021-08-25 MED ORDER — ONDANSETRON HCL 4 MG PO TABS
4.0000 mg | ORAL_TABLET | Freq: Four times a day (QID) | ORAL | Status: DC | PRN
  Filled 2021-08-25: qty 1

## 2021-08-25 MED ORDER — FERROUS SULFATE 325 (65 FE) MG PO TABS
325.0000 mg | ORAL_TABLET | Freq: Three times a day (TID) | ORAL | Status: DC
  Administered 2021-08-25 – 2021-08-27 (×6): 325 mg via ORAL
  Filled 2021-08-25 (×6): qty 1

## 2021-08-25 MED ORDER — DOCUSATE SODIUM 100 MG PO CAPS
100.0000 mg | ORAL_CAPSULE | Freq: Two times a day (BID) | ORAL | Status: DC
  Administered 2021-08-25 – 2021-08-27 (×4): 100 mg via ORAL
  Filled 2021-08-25 (×4): qty 1

## 2021-08-25 MED ORDER — DEXAMETHASONE SODIUM PHOSPHATE 10 MG/ML IJ SOLN: 8.0000 mg | Freq: Once | INTRAMUSCULAR | Status: DC

## 2021-08-25 MED ORDER — MIDAZOLAM HCL 5 MG/5ML IJ SOLN
INTRAMUSCULAR | Status: DC | PRN
  Administered 2021-08-25: 2 mg via INTRAVENOUS

## 2021-08-25 MED ORDER — ONDANSETRON HCL 4 MG/2ML IJ SOLN
INTRAMUSCULAR | Status: AC
  Filled 2021-08-25: qty 2

## 2021-08-25 MED ORDER — HYDROCODONE-ACETAMINOPHEN 5-325 MG PO TABS
1.0000 | ORAL_TABLET | ORAL | Status: DC | PRN
  Administered 2021-08-25: 1 via ORAL

## 2021-08-25 MED ORDER — FENTANYL CITRATE PF 50 MCG/ML IJ SOSY
PREFILLED_SYRINGE | INTRAMUSCULAR | Status: AC
  Administered 2021-08-25: 50 ug via INTRAVENOUS
  Filled 2021-08-25: qty 1

## 2021-08-25 MED ORDER — PROPOFOL 500 MG/50ML IV EMUL
INTRAVENOUS | Status: DC | PRN
  Administered 2021-08-25: 55 ug/kg/min via INTRAVENOUS

## 2021-08-25 MED ORDER — CARVEDILOL 25 MG PO TABS
25.0000 mg | ORAL_TABLET | Freq: Two times a day (BID) | ORAL | Status: DC
  Administered 2021-08-25 – 2021-08-27 (×4): 25 mg via ORAL
  Filled 2021-08-25 (×4): qty 1

## 2021-08-25 MED ORDER — EPHEDRINE SULFATE-NACL 50-0.9 MG/10ML-% IV SOSY
PREFILLED_SYRINGE | INTRAVENOUS | Status: DC | PRN
  Administered 2021-08-25 (×2): 10 mg via INTRAVENOUS
  Administered 2021-08-25: 5 mg via INTRAVENOUS
  Administered 2021-08-25: 10 mg via INTRAVENOUS

## 2021-08-25 MED ORDER — PROPOFOL 1000 MG/100ML IV EMUL
INTRAVENOUS | Status: AC
  Filled 2021-08-25: qty 100

## 2021-08-25 MED ORDER — TIZANIDINE HCL 4 MG PO TABS
4.0000 mg | ORAL_TABLET | Freq: Three times a day (TID) | ORAL | Status: DC | PRN
  Administered 2021-08-25 – 2021-08-27 (×4): 4 mg via ORAL
  Filled 2021-08-25 (×4): qty 1

## 2021-08-25 MED ORDER — MENTHOL 3 MG MT LOZG: 1.0000 | LOZENGE | OROMUCOSAL | Status: DC | PRN

## 2021-08-25 MED ORDER — PHENYLEPHRINE 40 MCG/ML (10ML) SYRINGE FOR IV PUSH (FOR BLOOD PRESSURE SUPPORT)
PREFILLED_SYRINGE | INTRAVENOUS | Status: DC | PRN
  Administered 2021-08-25 (×2): 80 ug via INTRAVENOUS

## 2021-08-25 MED ORDER — POLYETHYLENE GLYCOL 3350 17 G PO PACK: 17.0000 g | PACK | Freq: Every day | ORAL | Status: DC | PRN

## 2021-08-25 MED ORDER — ONDANSETRON HCL 4 MG/2ML IJ SOLN: 4.0000 mg | Freq: Four times a day (QID) | INTRAMUSCULAR | Status: DC | PRN

## 2021-08-25 MED ORDER — PANTOPRAZOLE SODIUM 40 MG PO TBEC
40.0000 mg | DELAYED_RELEASE_TABLET | Freq: Every day | ORAL | Status: DC
  Administered 2021-08-26 – 2021-08-27 (×2): 40 mg via ORAL
  Filled 2021-08-25 (×2): qty 1

## 2021-08-25 MED ORDER — HYDROXYZINE HCL 25 MG PO TABS: 25.0000 mg | ORAL_TABLET | Freq: Every day | ORAL | Status: DC | PRN

## 2021-08-25 MED ORDER — ACETAMINOPHEN 325 MG PO TABS: 325.0000 mg | ORAL_TABLET | Freq: Four times a day (QID) | ORAL | Status: DC | PRN

## 2021-08-25 MED ORDER — METOCLOPRAMIDE HCL 5 MG PO TABS: 5.0000 mg | ORAL_TABLET | Freq: Three times a day (TID) | ORAL | Status: DC | PRN

## 2021-08-25 MED ORDER — GLYCOPYRROLATE 0.2 MG/ML IJ SOLN
INTRAMUSCULAR | Status: DC | PRN
  Administered 2021-08-25: .2 mg via INTRAVENOUS

## 2021-08-25 MED ORDER — LACTATED RINGERS IV SOLN: INTRAVENOUS | Status: DC

## 2021-08-25 MED ORDER — PHENYLEPHRINE HCL-NACL 20-0.9 MG/250ML-% IV SOLN
INTRAVENOUS | Status: DC | PRN
  Administered 2021-08-25: 65 ug/min via INTRAVENOUS

## 2021-08-25 MED ORDER — CEFAZOLIN SODIUM-DEXTROSE 2-4 GM/100ML-% IV SOLN
2.0000 g | INTRAVENOUS | Status: AC
  Administered 2021-08-25: 2 g via INTRAVENOUS
  Filled 2021-08-25: qty 100

## 2021-08-25 MED ORDER — SACUBITRIL-VALSARTAN 97-103 MG PO TABS
1.0000 | ORAL_TABLET | Freq: Two times a day (BID) | ORAL | Status: DC
  Administered 2021-08-25 – 2021-08-27 (×4): 1 via ORAL
  Filled 2021-08-25 (×5): qty 1

## 2021-08-25 MED ORDER — CHLORHEXIDINE GLUCONATE 0.12 % MT SOLN
15.0000 mL | Freq: Once | OROMUCOSAL | Status: AC
  Administered 2021-08-25: 15 mL via OROMUCOSAL

## 2021-08-25 MED ORDER — PHENOL 1.4 % MT LIQD: 1.0000 | OROMUCOSAL | Status: DC | PRN

## 2021-08-25 MED ORDER — GABAPENTIN 300 MG PO CAPS
600.0000 mg | ORAL_CAPSULE | Freq: Two times a day (BID) | ORAL | Status: DC
  Administered 2021-08-25 – 2021-08-27 (×4): 600 mg via ORAL
  Filled 2021-08-25 (×4): qty 2

## 2021-08-25 MED ORDER — STERILE WATER FOR IRRIGATION IR SOLN
Status: DC | PRN
  Administered 2021-08-25: 1000 mL

## 2021-08-25 MED ORDER — CEFAZOLIN SODIUM-DEXTROSE 2-4 GM/100ML-% IV SOLN
2.0000 g | Freq: Four times a day (QID) | INTRAVENOUS | Status: AC
  Administered 2021-08-25 (×2): 2 g via INTRAVENOUS
  Filled 2021-08-25 (×2): qty 100

## 2021-08-25 MED ORDER — MECLIZINE HCL 25 MG PO TABS: 25.0000 mg | ORAL_TABLET | Freq: Three times a day (TID) | ORAL | Status: DC | PRN

## 2021-08-25 MED ORDER — FLUTICASONE FUROATE-VILANTEROL 100-25 MCG/ACT IN AEPB
1.0000 | INHALATION_SPRAY | Freq: Every day | RESPIRATORY_TRACT | Status: DC
  Administered 2021-08-26 – 2021-08-27 (×2): 1 via RESPIRATORY_TRACT
  Filled 2021-08-25: qty 28

## 2021-08-25 MED ORDER — TRANEXAMIC ACID-NACL 1000-0.7 MG/100ML-% IV SOLN
1000.0000 mg | INTRAVENOUS | Status: AC
  Administered 2021-08-25: 1000 mg via INTRAVENOUS
  Filled 2021-08-25: qty 100

## 2021-08-25 MED ORDER — ORAL CARE MOUTH RINSE: 15.0000 mL | Freq: Once | OROMUCOSAL | Status: AC

## 2021-08-25 MED ORDER — PROPOFOL 10 MG/ML IV BOLUS
INTRAVENOUS | Status: AC
  Filled 2021-08-25: qty 20

## 2021-08-25 MED ORDER — FENTANYL CITRATE (PF) 100 MCG/2ML IJ SOLN
INTRAMUSCULAR | Status: AC
  Filled 2021-08-25: qty 2

## 2021-08-25 MED ORDER — MIDAZOLAM HCL 2 MG/2ML IJ SOLN
INTRAMUSCULAR | Status: AC
  Filled 2021-08-25: qty 2

## 2021-08-25 MED ORDER — DEXAMETHASONE SODIUM PHOSPHATE 10 MG/ML IJ SOLN
10.0000 mg | Freq: Once | INTRAMUSCULAR | Status: AC
  Administered 2021-08-26: 10 mg via INTRAVENOUS
  Filled 2021-08-25: qty 1

## 2021-08-25 MED ORDER — HYDROCODONE-ACETAMINOPHEN 5-325 MG PO TABS
ORAL_TABLET | ORAL | Status: AC
  Filled 2021-08-25: qty 1

## 2021-08-25 MED ORDER — ONDANSETRON HCL 4 MG/2ML IJ SOLN
INTRAMUSCULAR | Status: DC | PRN
  Administered 2021-08-25: 4 mg via INTRAVENOUS

## 2021-08-25 MED ORDER — ASPIRIN 81 MG PO CHEW
81.0000 mg | CHEWABLE_TABLET | Freq: Two times a day (BID) | ORAL | Status: DC
  Administered 2021-08-25 – 2021-08-27 (×4): 81 mg via ORAL
  Filled 2021-08-25 (×4): qty 1

## 2021-08-25 MED ORDER — VENLAFAXINE HCL 75 MG PO TABS
75.0000 mg | ORAL_TABLET | Freq: Every day | ORAL | Status: DC
Start: 2021-08-25 — End: 2021-08-27
  Administered 2021-08-25 – 2021-08-27 (×3): 75 mg via ORAL
  Filled 2021-08-25 (×3): qty 1

## 2021-08-25 MED ORDER — ACETAMINOPHEN 500 MG PO TABS: 1000.0000 mg | ORAL_TABLET | Freq: Once | ORAL | Status: AC

## 2021-08-25 MED ORDER — HYDROCODONE-ACETAMINOPHEN 7.5-325 MG PO TABS
ORAL_TABLET | ORAL | Status: AC
  Filled 2021-08-25: qty 1

## 2021-08-25 MED ORDER — FENTANYL CITRATE (PF) 100 MCG/2ML IJ SOLN
INTRAMUSCULAR | Status: DC | PRN
  Administered 2021-08-25: 50 ug via INTRAVENOUS

## 2021-08-25 MED ORDER — PHENYLEPHRINE 40 MCG/ML (10ML) SYRINGE FOR IV PUSH (FOR BLOOD PRESSURE SUPPORT)
PREFILLED_SYRINGE | INTRAVENOUS | Status: AC
  Filled 2021-08-25: qty 10

## 2021-08-25 MED ORDER — SODIUM CHLORIDE 0.9 % IV SOLN: INTRAVENOUS | Status: DC

## 2021-08-25 MED ORDER — HYDROCODONE-ACETAMINOPHEN 7.5-325 MG PO TABS
1.0000 | ORAL_TABLET | ORAL | Status: DC | PRN
  Administered 2021-08-25 – 2021-08-27 (×7): 1 via ORAL
  Filled 2021-08-25 (×6): qty 1

## 2021-08-25 MED ORDER — FENTANYL CITRATE PF 50 MCG/ML IJ SOSY: 25.0000 ug | PREFILLED_SYRINGE | INTRAMUSCULAR | Status: DC | PRN

## 2021-08-25 MED ORDER — ACETAMINOPHEN 500 MG PO TABS
ORAL_TABLET | ORAL | Status: AC
  Administered 2021-08-25: 1000 mg via ORAL
  Filled 2021-08-25: qty 2

## 2021-08-25 MED ORDER — MORPHINE SULFATE (PF) 2 MG/ML IV SOLN
0.5000 mg | INTRAVENOUS | Status: DC | PRN
  Administered 2021-08-25: 1 mg via INTRAVENOUS
  Filled 2021-08-25: qty 1

## 2021-08-25 SURGICAL SUPPLY — 41 items
BAG COUNTER SPONGE SURGICOUNT (BAG) IMPLANT
BAG DECANTER FOR FLEXI CONT (MISCELLANEOUS) IMPLANT
BAG ZIPLOCK 12X15 (MISCELLANEOUS) IMPLANT
BLADE SAG 18X100X1.27 (BLADE) ×2 IMPLANT
COVER PERINEAL POST (MISCELLANEOUS) ×2 IMPLANT
COVER SURGICAL LIGHT HANDLE (MISCELLANEOUS) ×2 IMPLANT
CUP ACET PINNACLE SECTR 56MM (Hips) IMPLANT
DERMABOND ADVANCED (GAUZE/BANDAGES/DRESSINGS) ×1
DERMABOND ADVANCED .7 DNX12 (GAUZE/BANDAGES/DRESSINGS) ×1 IMPLANT
DRAPE FOOT SWITCH (DRAPES) ×2 IMPLANT
DRAPE STERI IOBAN 125X83 (DRAPES) ×2 IMPLANT
DRAPE U-SHAPE 47X51 STRL (DRAPES) ×4 IMPLANT
DRESSING AQUACEL AG SP 3.5X10 (GAUZE/BANDAGES/DRESSINGS) ×1 IMPLANT
DRSG AQUACEL AG SP 3.5X10 (GAUZE/BANDAGES/DRESSINGS) ×2
DURAPREP 26ML APPLICATOR (WOUND CARE) ×2 IMPLANT
ELECT REM PT RETURN 15FT ADLT (MISCELLANEOUS) ×2 IMPLANT
ELIMINATOR HOLE APEX DEPUY (Hips) ×1 IMPLANT
GAUZE 4X4 16PLY ~~LOC~~+RFID DBL (SPONGE) IMPLANT
GLOVE SURG ENC MOIS LTX SZ6 (GLOVE) ×2 IMPLANT
GLOVE SURG ENC MOIS LTX SZ7 (GLOVE) ×2 IMPLANT
GLOVE SURG UNDER LTX SZ6.5 (GLOVE) ×2 IMPLANT
GLOVE SURG UNDER POLY LF SZ7.5 (GLOVE) ×2 IMPLANT
GOWN STRL REUS W/TWL LRG LVL3 (GOWN DISPOSABLE) ×4 IMPLANT
HEAD CERAMIC 36 PLUS5 (Hips) ×1 IMPLANT
HOLDER FOLEY CATH W/STRAP (MISCELLANEOUS) ×2 IMPLANT
KIT TURNOVER KIT A (KITS) IMPLANT
PACK ANTERIOR HIP CUSTOM (KITS) ×2 IMPLANT
PINNACLE ALTRX PLUS 4 N 36X56 (Hips) ×1 IMPLANT
PINNACLE SECTOR CUP 56MM (Hips) ×2 IMPLANT
SCREW 6.5MMX30MM (Screw) ×1 IMPLANT
SPONGE T-LAP 18X18 ~~LOC~~+RFID (SPONGE) ×6 IMPLANT
STEM FEMORAL SZ6 HIGH ACTIS (Stem) ×1 IMPLANT
SUT MNCRL AB 4-0 PS2 18 (SUTURE) ×2 IMPLANT
SUT STRATAFIX 0 PDS 27 VIOLET (SUTURE) ×2
SUT VIC AB 1 CT1 36 (SUTURE) ×6 IMPLANT
SUT VIC AB 2-0 CT1 27 (SUTURE) ×2
SUT VIC AB 2-0 CT1 TAPERPNT 27 (SUTURE) ×2 IMPLANT
SUTURE STRATFX 0 PDS 27 VIOLET (SUTURE) ×1 IMPLANT
TRAY FOLEY MTR SLVR 16FR STAT (SET/KITS/TRAYS/PACK) IMPLANT
TUBE SUCTION HIGH CAP CLEAR NV (SUCTIONS) ×2 IMPLANT
WATER STERILE IRR 1000ML POUR (IV SOLUTION) ×2 IMPLANT

## 2021-08-25 NOTE — Transfer of Care (Signed)
Immediate Anesthesia Transfer of Care Note  Patient: Yvonne Campbell  Procedure(s) Performed: Procedure(s): TOTAL HIP ARTHROPLASTY ANTERIOR APPROACH (Right)  Patient Location: PACU  Anesthesia Type:Spinal  Level of Consciousness:  sedated, patient cooperative and responds to stimulation  Airway & Oxygen Therapy:Patient Spontanous Breathing and Patient connected to face mask oxgen  Post-op Assessment:  Report given to PACU RN and Post -op Vital signs reviewed and stable  Post vital signs:  Reviewed and stable  Last Vitals:  Vitals:   08/25/21 0943  BP: 129/72  Pulse: (!) 55  Resp: 18  Temp: 36.4 C  SpO2: 51%    Complications: No apparent anesthesia complications

## 2021-08-25 NOTE — Discharge Instructions (Signed)

## 2021-08-25 NOTE — Interval H&P Note (Signed)
History and Physical Interval Note:  08/25/2021 10:01 AM  Yvonne Campbell  has presented today for surgery, with the diagnosis of Right hip osteoarthritis.  The various methods of treatment have been discussed with the patient and family. After consideration of risks, benefits and other options for treatment, the patient has consented to  Procedure(s): TOTAL HIP ARTHROPLASTY ANTERIOR APPROACH (Right) as a surgical intervention.  The patient's history has been reviewed, patient examined, no change in status, stable for surgery.  I have reviewed the patient's chart and labs.  Questions were answered to the patient's satisfaction.     Mauri Pole

## 2021-08-25 NOTE — Anesthesia Postprocedure Evaluation (Signed)
Anesthesia Post Note  Patient: Yvonne Campbell  Procedure(s) Performed: TOTAL HIP ARTHROPLASTY ANTERIOR APPROACH (Right: Hip)     Patient location during evaluation: PACU Anesthesia Type: Spinal Level of consciousness: oriented and awake and alert Pain management: pain level controlled Vital Signs Assessment: post-procedure vital signs reviewed and stable Respiratory status: spontaneous breathing, respiratory function stable and patient connected to nasal cannula oxygen Cardiovascular status: blood pressure returned to baseline and stable Postop Assessment: no headache, no backache, no apparent nausea or vomiting, spinal receding and patient able to bend at knees Anesthetic complications: no   No notable events documented.  Last Vitals:  Vitals:   08/25/21 1500 08/25/21 1515  BP: 129/68 125/74  Pulse: (!) 51 (!) 57  Resp: 15 13  Temp: 36.7 C   SpO2: 94% 91%    Last Pain:  Vitals:   08/25/21 1515  TempSrc:   PainSc: Asleep                 Mitsuo Budnick,W. EDMOND

## 2021-08-25 NOTE — Op Note (Signed)
NAME:  [name]                ACCOUNT NO.: [id number]      MEDICAL RECORD NO.: [id number]      FACILITY:  [facility]      PHYSICIAN:  Mauri Pole  DATE OF BIRTH:  12-Jul-1951     DATE OF PROCEDURE:  08/25/2021                                 OPERATIVE REPORT         PREOPERATIVE DIAGNOSIS: Right  hip osteoarthritis.      POSTOPERATIVE DIAGNOSIS:  Right hip osteoarthritis.      PROCEDURE:  Right total hip replacement through an anterior approach   utilizing DePuy THR system, component size 56 mm pinnacle cup, a size 36+4 neutral   Altrex liner, a size 6 hi  Actis stem with a 36+5 delta ceramic   ball.      SURGEON:  Pietro Cassis. Alvan Dame, M.D.      ASSISTANT:  Costella Hatcher, PA-C     ANESTHESIA:  Spinal.      SPECIMENS:  None.      COMPLICATIONS:  None.      BLOOD LOSS:  250 cc     DRAINS:  None.      INDICATION OF THE PROCEDURE:  Yvonne Campbell is a 71 y.o. female who had   presented to office for evaluation of right hip pain.  Radiographs revealed   progressive degenerative changes with bone-on-bone   articulation of the  hip joint, including subchondral cystic changes and osteophytes.  The patient had painful limited range of   motion significantly affecting their overall quality of life and function.  The patient was failing to    respond to conservative measures including medications and/or injections and activity modification and at this point was ready   to proceed with more definitive measures.  Consent was obtained for   benefit of pain relief.  Specific risks of infection, DVT, component   failure, dislocation, neurovascular injury, and need for revision surgery were reviewed in the office as well discussion of   the anterior versus posterior approach were reviewed.     PROCEDURE IN DETAIL:  The patient was brought to operative theater.   Once adequate anesthesia, preoperative antibiotics, 2 gm of Ancef, 1 gm of Tranexamic Acid, and 10  mg of Decadron were administered, the patient was positioned supine on the [employer] table.  Once the patient was safely positioned with adequate padding of boney prominences we predraped out the hip, and used fluoroscopy to confirm orientation of the pelvis.      The right hip was then prepped and draped from proximal iliac crest to   mid thigh with a shower curtain technique.      Time-out was performed identifying the patient, planned procedure, and the appropriate extremity.     An incision was then made 2 cm lateral to the   anterior superior iliac spine extending over the orientation of the   tensor fascia lata muscle and sharp dissection was carried down to the   fascia of the muscle.      The fascia was then incised.  The muscle belly was identified and swept   laterally and retractor placed along the superior neck.  Following   cauterization of the circumflex vessels and removing  some pericapsular   fat, a second cobra retractor was placed on the inferior neck.  A T-capsulotomy was made along the line of the   superior neck to the trochanteric fossa, then extended proximally and   distally.  Tag sutures were placed and the retractors were then placed   intracapsular.  We then identified the trochanteric fossa and   orientation of my neck cut and then made a neck osteotomy with the femur on traction.  The femoral   head was removed without difficulty or complication.  Traction was let   off and retractors were placed posterior and anterior around the   acetabulum.      The labrum and foveal tissue were debrided.  I began reaming with a 51 mm   reamer and reamed up to 55 mm reamer with good bony bed preparation and a 56 mm  cup was chosen.  The final 56 mm Pinnacle cup was then impacted under fluoroscopy to confirm the depth of penetration and orientation with respect to   Abduction and forward flexion.  A screw was placed into the ilium followed by the hole eliminator.  The final    36+4 neutral Altrex liner was impacted with good visualized rim fit.  The cup was positioned anatomically within the acetabular portion of the pelvis.      At this point, the femur was rolled to 100 degrees.  Further capsule was   released off the inferior aspect of the femoral neck.  I then   released the superior capsule proximally.  With the leg in a neutral position the hook was placed laterally   along the femur under the vastus lateralis origin and elevated manually and then held in position using the hook attachment on the bed.  The leg was then extended and adducted with the leg rolled to 100   degrees of external rotation.  Retractors were placed along the medial calcar and posteriorly over the greater trochanter.  Once the proximal femur was fully   exposed, I used a box osteotome to set orientation.  I then began   broaching with the starting chili pepper broach and passed this by hand and then broached up to 6.  With the 6 broach in place I chose a high offset neck and did several trial reductions.  The offset was appropriate, leg lengths   appeared to be equal best matched with the +5 head ball trial confirmed radiographically.   Given these findings, I went ahead and dislocated the hip, repositioned all   retractors and positioned the right hip in the extended and abducted position.  The final 6 Hi Actis stem was   chosen and it was impacted down to the level of neck cut.  Based on this   and the trial reductions, a final 36+5 delta ceramic ball was chosen and   impacted onto a clean and dry trunnion, and the hip was reduced.  The   hip had been irrigated throughout the case again at this point.  I did   reapproximate the superior capsular leaflet to the anterior leaflet   using #1 Vicryl.  The fascia of the   tensor fascia lata muscle was then reapproximated using #1 Vicryl and #0 Stratafix sutures.  The   remaining wound was closed with 2-0 Vicryl and running 4-0 Monocryl.    The hip was cleaned, dried, and dressed sterilely using Dermabond and   Aquacel dressing.  The patient was then brought  to recovery room in stable condition tolerating the procedure well.    Costella Hatcher, PA-C was present for the entirety of the case involved from   preoperative positioning, perioperative retractor management, general   facilitation of the case, as well as primary wound closure as assistant.            Pietro Cassis Alvan Dame, M.D.        08/25/2021 11:24 AM

## 2021-08-25 NOTE — H&P (Signed)
TOTAL HIP ADMISSION H&P  Patient is admitted for right total hip arthroplasty.  Subjective:  Chief Complaint: right hip pain  HPI: Yvonne Campbell, 71 y.o. female, has a history of pain and functional disability in the right hip(s) due to arthritis and patient has failed non-surgical conservative treatments for greater than 12 weeks to include NSAID's and/or analgesics and activity modification.  Onset of symptoms was gradual starting 2 years ago with gradually worsening course since that time.The patient noted no past surgery on the right hip(s).  Patient currently rates pain in the right hip at 8 out of 10 with activity. Patient has worsening of pain with activity and weight bearing, pain that interfers with activities of daily living, and pain with passive range of motion. Patient has evidence of joint space narrowing by imaging studies. This condition presents safety issues increasing the risk of falls. There is no current active infection.  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   Past Medical History:  Diagnosis Date   Acute bronchitis    Anemia    Anxiety    CHF (congestive heart failure) (HCC)    COPD (chronic obstructive pulmonary disease) ([place])    Drug use    Epistaxis 11/22/2018   GERD (gastroesophageal reflux disease)    History of vitamin D deficiency    Hyperlipidemia    Hypertension     Hypertensive heart disease with heart failure ([place]) 08/29/2018   Essential hypertension   Impaired glucose tolerance    Left ventricular dysfunction 08/29/2018   Left ventricular dysfunction   Leukemia (HCC)    LVH (left ventricular hypertrophy)    Menopause    Paresthesia of hand    left   Pneumonia of right lower lobe due to infectious organism 10/23/2018   Pre-diabetes    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   Primary osteoarthritis of right hip 05/01/2018   Severe mitral regurgitation 08/29/2018   Mitral regurgitation   Stroke ([place])    Tobacco abuse 08/29/2018   Tobacco abuse    Past Surgical History:  Procedure Laterality Date   CARDIAC CATHETERIZATION     CESAREAN SECTION     CHOLECYSTECTOMY  2011   REVERSE SHOULDER ARTHROPLASTY Right 09/05/2020   Procedure: REVERSE SHOULDER ARTHROPLASTY;  Surgeon: Netta Cedars, MD;  Location: [facility];  Service: Orthopedics;  Laterality: Right;  with interscalene block   RIGHT/LEFT HEART CATH AND CORONARY ANGIOGRAPHY N/A 09/04/2018   Procedure: RIGHT/LEFT HEART CATH AND CORONARY ANGIOGRAPHY;  Surgeon: Lorretta Harp, MD;  Location: [facility];  Service: Cardiovascular;  Laterality: N/A;    No current facility-administered medications for this encounter.   Current Outpatient Medications  Medication Sig Dispense Refill Last Dose   carvedilol (COREG) 25 MG tablet Take 25 mg by mouth 2 (two) times daily.      ENTRESTO 97-103 MG Take 1 tablet by mouth 2 (  two) times a day.      fluticasone furoate-vilanterol (BREO ELLIPTA) 100-25 MCG/ACT AEPB Inhale 1 puff into the lungs daily.      gabapentin (NEURONTIN) 300 MG capsule Take 600 mg by mouth 2 (two) times daily.      hydrOXYzine (ATARAX/VISTARIL) 25 MG tablet Take 25 mg by mouth daily as needed for anxiety.      meclizine (ANTIVERT) 25 MG tablet Take 25 mg by mouth 3 (three) times daily as needed for dizziness.      omeprazole (PRILOSEC) 40 MG capsule Take 40 mg by mouth.       PROAIR HFA 108 (90 Base) MCG/ACT inhaler Inhale 2 puffs into the lungs every 6 (six) hours as needed for wheezing or shortness of breath.      sodium chloride (OCEAN) 0.65 % nasal spray Place 2 sprays into the nose as needed for congestion.      venlafaxine (EFFEXOR) 75 MG tablet Take 75 mg by mouth daily.      HYDROcodone-acetaminophen (NORCO) 5-325 MG tablet Take 1-2 tablets by mouth every 4 (four) hours as needed for moderate pain or severe pain. (Patient not taking: Reported on 10/06/2020) 40 tablet 0    tiZANidine (ZANAFLEX) 2 MG tablet Take 1 tablet (2 mg total) by mouth every 8 (eight) hours as needed for muscle spasms. (Patient not taking: Reported on 10/06/2020) 30 tablet 0    traMADol (ULTRAM) 50 MG tablet Take 50 mg by mouth daily. (Patient not taking: Reported on 10/06/2020)      Allergies  Allergen Reactions   Antiseborrheic Products, Misc. Swelling   Latex Rash   Penicillins Swelling    Did it involve swelling of the face/tongue/throat, SOB, or low BP? Unknown Did it involve sudden or severe rash/hives, skin peeling, or any reaction on the inside of your mouth or nose? Unknown Did you need to seek medical attention at a hospital or doctor's office? Unknown When did it last happen?      unk If all above answers are "NO", may proceed with cephalosporin use.    Sulfa Antibiotics Itching and Rash    Social History   Tobacco Use   Smoking status: Some Days    Packs/day: 0.50    Years: 35.00    Pack years: 17.50    Types: Cigarettes    Last attempt to quit: 10/2018    Years since quitting: 2.8   Smokeless tobacco: Never   Tobacco comments:    10/06/20 3-4 daily  Substance Use Topics   Alcohol use: Not Currently    Comment: 10/06/20 none    Family History  Problem Relation Age of Onset   Hypertension Mother    Breast cancer Mother    Heart failure Mother    Heart attack Paternal Grandfather    Hypertension Paternal Grandfather    Heart failure Father      Review  of Systems  Constitutional:  Negative for chills and fever.  Respiratory:  Negative for cough and shortness of breath.   Cardiovascular:  Negative for chest pain.  Gastrointestinal:  Negative for nausea and vomiting.  Musculoskeletal:  Positive for arthralgias.    Objective:  Physical Exam Well nourished and well developed. General: Alert and oriented x3, cooperative and pleasant, no acute distress. Head: normocephalic, atraumatic, neck supple. Eyes: EOMI.  Musculoskeletal: Right Hip: Mild tenderness to palpation about the right greater trochanteric bursa. Pain with passive and active motion of the right hip. Limited rotation with pain.  Calves soft  and nontender. Motor function intact in LE. Strength 5/5 LE bilaterally. Neuro: Distal pulses 2+. Sensation to light touch intact in LE.  Vital signs in last 24 hours:    Labs:   Estimated body mass index is 27.28 kg/m as calculated from the following:   Height as of 08/05/21: 5\' 3"  (1.6 m).   Weight as of 08/05/21: 69.9 kg.   Imaging Review Plain radiographs demonstrate severe degenerative joint disease of the right hip(s). The bone quality appears to be adequate for age and reported activity level.      Assessment/Plan:  End stage arthritis, right hip(s)  The patient history, physical examination, clinical judgement of the provider and imaging studies are consistent with end stage degenerative joint disease of the right hip(s) and total hip arthroplasty is deemed medically necessary. The treatment options including medical management, injection therapy, arthroscopy and arthroplasty were discussed at length. The risks and benefits of total hip arthroplasty were presented and reviewed. The risks due to aseptic loosening, infection, stiffness, dislocation/subluxation,  thromboembolic complications and other imponderables were discussed.  The patient acknowledged the explanation, agreed to proceed with the plan and consent was  signed. Patient is being admitted for inpatient treatment for surgery, pain control, PT, OT, prophylactic antibiotics, VTE prophylaxis, progressive ambulation and ADL's and discharge planning.The patient is planning to be discharged  home.  Therapy Plans: HEP Disposition: Home with grandchildren Planned DVT Prophylaxis: aspirin 81mg  BID DME needed: walker PCP: Windell Hummingbird Cardiologist: Dr Lawson Radar, clearance received (hx of LV dysfunction, low EF) TXA: IV Allergies: PCN - hives, angioedema, sulfa drugs - hives & angioedema Anesthesia Concerns: none BMI: 27.8 Last HgbA1c: Not diabetic  Other: - Does okay with cephalosporins - Norco, robaxin, tylenol  Costella Hatcher, PA-C Orthopedic Surgery [facility] [place] [phone]

## 2021-08-25 NOTE — Evaluation (Signed)
Physical Therapy Evaluation Patient Details Name: Yvonne Campbell MRN: [id number] DOB: 07/26/1951 Today's Date: 08/25/2021  History of Present Illness  Pt is 71 yo female s/p R anterior THA on 08/25/21.  Pt with hx including but not limited to HTN, HLD, drug and tobacco use, DM, PNE, OA, R reverse TSA  Clinical Impression  Pt is s/p THA resulting in the deficits listed below (see PT Problem List). Pt is somewhat questionable/brief historian due to distracted by severe pain.  She reports she was independent and will have assist from dtr and grandson at d/c.  Does report she lives in multi-level house with bed/bath upstairs, no half bath on main level.  Today,  Pt with severe pain (grimacing, restless, moaning) stating "something is not right, It should not hurt this bad."  Pt had received oral meds earlier and RN administered morphine during eval that pt reports "did nothing."  Attempted, to see if change in position would relieve pain but no improvement with partial sitting EOB - pain increased and pt returned to supine.  Did reposition with pillows that seemed to somewhat improve symptoms but pt still reporting 10/10.  Will progress as able as pain controlled. Pt will benefit from skilled PT to increase their independence and safety with mobility to allow discharge to the venue listed below.         Recommendations for follow up therapy are one component of a multi-disciplinary discharge planning process, led by the attending physician.  Recommendations may be updated based on patient status, additional functional criteria and insurance authorization.  Follow Up Recommendations Follow physician's recommendations for discharge plan and follow up therapies    Assistance Recommended at Discharge Frequent or constant Supervision/Assistance  Patient can return home with the following  A lot of help with walking and/or transfers;A lot of help with bathing/dressing/bathroom;Assistance with  cooking/housework;Help with stairs or ramp for entrance    Equipment Recommendations Rolling walker (2 wheels)  Recommendations for Other Services       Functional Status Assessment Patient has had a recent decline in their functional status and demonstrates the ability to make significant improvements in function in a reasonable and predictable amount of time.     Precautions / Restrictions Precautions Precautions: Fall Precaution Comments: Pain control Restrictions Weight Bearing Restrictions: Yes RLE Weight Bearing: Weight bearing as tolerated      Mobility  Bed Mobility Overal bed mobility: Needs Assistance Bed Mobility: Supine to Sit, Sit to Supine     Supine to sit: Mod assist Sit to supine: Mod assist   General bed mobility comments: max cues, mod A to lift trunk; tried sitting EOB to see if change in position would alleviate pain but pain worsened and pt only getting partially to EOB    Transfers                   General transfer comment: deferred due to pain    Ambulation/Gait                  Stairs            Wheelchair Mobility    Modified Rankin (Stroke Patients Only)       Balance Overall balance assessment: Needs assistance Sitting-balance support: Bilateral upper extremity supported Sitting balance-Leahy Scale: Poor Sitting balance - Comments: Requiring bil UE but for pain control       Standing balance comment: unable  Pertinent Vitals/Pain Pain Assessment Pain Assessment: 0-10 Pain Score: 10-Worst pain ever ("20") Pain Location: R groin Pain Descriptors / Indicators: Sharp, Shooting, Constant (pulilng) Pain Intervention(s): Limited activity within patient's tolerance, Monitored during session, RN gave pain meds during session, Premedicated before session, Repositioned, Relaxation, Ice applied    Home Living Family/patient expects to be discharged to:: Private  residence Living Arrangements: Children Available Help at Discharge: Family;Available 24 hours/day Type of Home: House Home Access: Stairs to enter Entrance Stairs-Rails: Psychiatric nurse of Steps: 4 Alternate Level Stairs-Number of Steps: flight Home Layout: Two level;Bed/bath upstairs (no bathroom on entry level) Home Equipment: Crutches;Cane - single point Additional Comments: Pt questionable and brief historian - distracted by pain    Prior Function Prior Level of Function : Independent/Modified Independent             Mobility Comments: states did not use AD prior to sx ADLs Comments: reports I with ADLs and IADLs     Hand Dominance        Extremity/Trunk Assessment   Upper Extremity Assessment Upper Extremity Assessment: Overall WFL for tasks assessed    Lower Extremity Assessment Lower Extremity Assessment: LLE deficits/detail;RLE deficits/detail RLE Deficits / Details: Surgical side.  Pt states "Something is not right, it should not hurt this bad."  ROM: ankle WFL, knee WFL, hip limited due to pain but [facility] and appears in normal position/alignment.  MMT: 3/5 ankle, unable to further test due to pain RLE Sensation: WNL LLE Deficits / Details: ROM WFL; MMT at least 3/5 but not further tested due to pain LLE Sensation: WNL    Cervical / Trunk Assessment Cervical / Trunk Assessment: Normal  Communication   Communication: No difficulties  Cognition Arousal/Alertness: Awake/alert Behavior During Therapy: Restless Overall Cognitive Status: Impaired/Different from baseline                                 General Comments: Distracted by pain; followed basic commands increased time; provided brief history        General Comments General comments (skin integrity, edema, etc.): Repositioned pt higher in bed so that hinge of bed at hips to flex, also placed pillow instead of blanket under knee and pillow under buttock - seemed to help  but pt still with severe pain    Exercises     Assessment/Plan    PT Assessment Patient needs continued PT services  PT Problem List Decreased strength;Decreased mobility;Decreased safety awareness;Decreased range of motion;Decreased coordination;Decreased knowledge of precautions;Decreased activity tolerance;Decreased cognition;Decreased balance;Decreased knowledge of use of DME;Pain       PT Treatment Interventions DME instruction;Therapeutic activities;Modalities;Gait training;Therapeutic exercise;Patient/family education;Balance training;Functional mobility training;Stair training    PT Goals (Current goals can be found in the Care Plan section)  Acute Rehab PT Goals Patient Stated Goal: decrease pain PT Goal Formulation: With patient Time For Goal Achievement: 09/08/21 Potential to Achieve Goals: Good    Frequency 7X/week     Co-evaluation               AM-PAC PT "6 Clicks" Mobility  Outcome Measure Help needed turning from your back to your side while in a flat bed without using bedrails?: A Lot Help needed moving from lying on your back to sitting on the side of a flat bed without using bedrails?: A Lot Help needed moving to and from a bed to a chair (including a wheelchair)?: Total Help needed standing up  from a chair using your arms (e.g., wheelchair or bedside chair)?: Total Help needed to walk in hospital room?: Total Help needed climbing 3-5 steps with a railing? : Total 6 Click Score: 8    End of Session Equipment Utilized During Treatment: Oxygen Activity Tolerance: Patient limited by pain Patient left: in bed;with call bell/phone within reach;with bed alarm set Nurse Communication: Mobility status;Patient requests pain meds PT Visit Diagnosis: Other abnormalities of gait and mobility (R26.89);Muscle weakness (generalized) (M62.81);Pain Pain - Right/Left: Right Pain - part of body: Hip    Time: 3662-9476 PT Time Calculation (min) (ACUTE ONLY): 23  min   Charges:   PT Evaluation $PT Eval Moderate Complexity: 1 Mod PT Treatments $Therapeutic Activity: 8-22 mins        Abran Richard, PT Acute Rehab Services Pager [phone] [facility] [phone]   Karlton Lemon 08/25/2021, 7:09 PM

## 2021-08-26 ENCOUNTER — Encounter (HOSPITAL_COMMUNITY): Payer: Self-pay | Admitting: Orthopedic Surgery

## 2021-08-26 LAB — CBC
HCT: 32.5 % — ABNORMAL LOW (ref 36.0–46.0)
Hemoglobin: 10.3 g/dL — ABNORMAL LOW (ref 12.0–15.0)
MCH: 29.9 pg (ref 26.0–34.0)
MCHC: 31.7 g/dL (ref 30.0–36.0)
MCV: 94.2 fL (ref 80.0–100.0)
Platelets: 189 10*3/uL (ref 150–400)
RBC: 3.45 MIL/uL — ABNORMAL LOW (ref 3.87–5.11)
RDW: 13.6 % (ref 11.5–15.5)
WBC: 8.5 10*3/uL (ref 4.0–10.5)
nRBC: 0 % (ref 0.0–0.2)

## 2021-08-26 LAB — BASIC METABOLIC PANEL
Anion gap: 4 — ABNORMAL LOW (ref 5–15)
BUN: 9 mg/dL (ref 8–23)
CO2: 27 mmol/L (ref 22–32)
Calcium: 8.5 mg/dL — ABNORMAL LOW (ref 8.9–10.3)
Chloride: 104 mmol/L (ref 98–111)
Creatinine, Ser: 1.04 mg/dL — ABNORMAL HIGH (ref 0.44–1.00)
GFR, Estimated: 57 mL/min — ABNORMAL LOW (ref >60)
Glucose, Bld: 124 mg/dL — ABNORMAL HIGH (ref 70–99)
Potassium: 4.2 mmol/L (ref 3.5–5.1)
Sodium: 135 mmol/L (ref 135–145)

## 2021-08-26 MED ORDER — HYDROCODONE-ACETAMINOPHEN 7.5-325 MG PO TABS: 1.0000 | ORAL_TABLET | ORAL | 0 refills | Status: DC | PRN

## 2021-08-26 MED ORDER — HYDROMORPHONE HCL 1 MG/ML IJ SOLN
0.5000 mg | INTRAMUSCULAR | Status: DC | PRN
  Administered 2021-08-26: 1 mg via INTRAVENOUS
  Filled 2021-08-26: qty 1

## 2021-08-26 MED ORDER — DOCUSATE SODIUM 100 MG PO CAPS: 100.0000 mg | ORAL_CAPSULE | Freq: Two times a day (BID) | ORAL | 0 refills | Status: AC

## 2021-08-26 MED ORDER — SODIUM CHLORIDE 0.9 % IV BOLUS
250.0000 mL | Freq: Once | INTRAVENOUS | Status: AC
  Administered 2021-08-26: 500 mL via INTRAVENOUS

## 2021-08-26 MED ORDER — TIZANIDINE HCL 4 MG PO TABS: 4.0000 mg | ORAL_TABLET | Freq: Three times a day (TID) | ORAL | 0 refills | Status: DC | PRN

## 2021-08-26 MED ORDER — POLYETHYLENE GLYCOL 3350 17 G PO PACK: 17.0000 g | PACK | Freq: Every day | ORAL | 0 refills | Status: AC | PRN

## 2021-08-26 MED ORDER — GUAIFENESIN ER 600 MG PO TB12: 600.0000 mg | ORAL_TABLET | Freq: Two times a day (BID) | ORAL | Status: DC | PRN

## 2021-08-26 MED ORDER — ASPIRIN 81 MG PO CHEW: 81.0000 mg | CHEWABLE_TABLET | Freq: Two times a day (BID) | ORAL | 0 refills | Status: AC

## 2021-08-26 NOTE — Plan of Care (Signed)
°  Problem: Education: Goal: Knowledge of the prescribed therapeutic regimen will improve Outcome: Progressing   Problem: Activity: Goal: Ability to avoid complications of mobility impairment will improve Outcome: Progressing Goal: Ability to tolerate increased activity will improve Outcome: Progressing   Problem: Pain Management: Goal: Pain level will decrease with appropriate interventions Outcome: Progressing   

## 2021-08-26 NOTE — Progress Notes (Signed)
Physical Therapy Treatment Patient Details Name: Yvonne Campbell MRN: [id number] DOB: July 31, 1950 Today's Date: 08/26/2021   History of Present Illness Pt is 71 yo female s/p R anterior THA on 08/25/21.  Pt with hx including but not limited to HTN, HLD, drug and tobacco use, DM, PNE, OA, R reverse TSA    PT Comments    POD # 1 Pt sleepy/groggy in bed on 2 lts nasal sat 90%.  Pt easily arouse but General Comments: AxO x 2 very "drunk" slurred speech, unsteady, laughing, distracted.  (pt was given 1mg  IV Dauladid at 8:21) for pain Assisted OOB to amb was difficult.  General transfer comment: required Min/Mod Assist with 75% VC's on proper hand placement, safety with turns and proper walker to self distance.General Gait Details: VERY unsteady DRUNKEN gait requiring 75% VC's on proper upright posture.  HIGH FALL RISK. Also required 2 lts nasal to achieve sats > 90%. Pt c/o increased mucous build up in her throat.  Returned to room and attempted some TE's however pt was too groggy.  Reported to RN pt's mental status. Will return later today for another PT session this afternoon.   Recommendations for follow up therapy are one component of a multi-disciplinary discharge planning process, led by the attending physician.  Recommendations may be updated based on patient status, additional functional criteria and insurance authorization.  Follow Up Recommendations  Follow physician's recommendations for discharge plan and follow up therapies     Assistance Recommended at Discharge Frequent or constant Supervision/Assistance  Patient can return home with the following A lot of help with walking and/or transfers;A lot of help with bathing/dressing/bathroom;Assistance with cooking/housework;Help with stairs or ramp for entrance   Equipment Recommendations  Rolling walker (2 wheels)    Recommendations for Other Services       Precautions / Restrictions Precautions Precautions: Fall Precaution Comments:  monitor sats Restrictions Weight Bearing Restrictions: No RLE Weight Bearing: Weight bearing as tolerated     Mobility  Bed Mobility Overal bed mobility: Needs Assistance Bed Mobility: Supine to Sit     Supine to sit: Min assist     General bed mobility comments: Assisted OOB required repeat instructions due to impaired cognition from IV pain meds    Transfers Overall transfer level: Needs assistance Equipment used: Rolling walker (2 wheels) Transfers: Sit to/from Stand, Bed to chair/wheelchair/BSC Sit to Stand: Min assist Stand pivot transfers: Min assist, Mod assist         General transfer comment: required Min/Mod Assist with 75% VC's on proper hand placement, safety with turns and proper walker to self distance.    Ambulation/Gait Ambulation/Gait assistance: Min assist, Mod assist Gait Distance (Feet): 22 Feet Assistive device: Rolling walker (2 wheels) Gait Pattern/deviations: Step-to pattern, Staggering left, Staggering right, Trunk flexed Gait velocity: decreased     General Gait Details: VERY unsteady DRUNKEN gait requiring 75% VC's on proper upright posture.  Also required 2 lts nasal to achieve sats > 90%.   Stairs             Wheelchair Mobility    Modified Rankin (Stroke Patients Only)       Balance                                            Cognition Arousal/Alertness: Awake/alert   Overall Cognitive Status: Impaired/Different from baseline  General Comments: AxO x 2 very "drunk" slurred speech, unsteady, laughing, distracted.  (pt was given 1mg  IV Dauladid at 8:21) for pain        Exercises      General Comments        Pertinent Vitals/Pain Pain Assessment Pain Assessment: Faces Faces Pain Scale: Hurts a little bit Pain Location: R hip Pain Descriptors / Indicators: Aching, Guarding, Operative site guarding Pain Intervention(s): Monitored during session,  Premedicated before session, Repositioned, Ice applied    Home Living                          Prior Function            PT Goals (current goals can now be found in the care plan section) Progress towards PT goals: Progressing toward goals    Frequency    7X/week      PT Plan Current plan remains appropriate    Co-evaluation              AM-PAC PT "6 Clicks" Mobility   Outcome Measure  Help needed turning from your back to your side while in a flat bed without using bedrails?: A Lot Help needed moving from lying on your back to sitting on the side of a flat bed without using bedrails?: A Lot Help needed moving to and from a bed to a chair (including a wheelchair)?: A Lot Help needed standing up from a chair using your arms (e.g., wheelchair or bedside chair)?: Total Help needed to walk in hospital room?: Total Help needed climbing 3-5 steps with a railing? : Total 6 Click Score: 9    End of Session Equipment Utilized During Treatment: Gait belt;Oxygen Activity Tolerance: Patient limited by lethargy Patient left: in chair;with call bell/phone within reach;with chair alarm set Nurse Communication: Mobility status PT Visit Diagnosis: Other abnormalities of gait and mobility (R26.89);Muscle weakness (generalized) (M62.81);Pain Pain - Right/Left: Right Pain - part of body: Hip     Time: 1000-1026 PT Time Calculation (min) (ACUTE ONLY): 26 min  Charges:  $Gait Training: 8-22 mins $Therapeutic Activity: 8-22 mins                     {Donika Butner  PTA Acute  Rehabilitation Services Pager      [phone] Office      [phone]

## 2021-08-26 NOTE — Progress Notes (Signed)
Physical Therapy Treatment °Patient Details °Name: Yvonne Campbell °MRN: [id number] °DOB: 10/15/1950 °Today's Date: 08/26/2021 ° ° °History of Present Illness Pt is 70 yo female s/p R anterior THA on 08/25/21.  Pt with hx including but not limited to HTN, HLD, drug and tobacco use, DM, PNE, OA, R reverse TSA ° °  °PT Comments  ° ° POD # 1 pm session. °Daughter present during session and very concerned about her Mom's decreased level of alertness, slurred speech and upper airway congestion. Had Daughter assist "hands on" with mobility.  General transfer comment: required Min/Mod Assist with 75% VC's on proper hand placement, safety with turns and proper walker to self distance.General Gait Details: VERY unsteady DRUNKEN gait requiring 75% VC's on proper upright posture.  Also required 2 lts nasal to achieve sats > 90%. Trial RA decreased to 80%.General stair comments: 75% VC's on proper tech and hand placement as well as sequencing. °Pt did NOT met her mobility goal to safely D/C to home today. ° °SATURATION QUALIFICATIONS: (This note is used to comply with regulatory documentation for home oxygen) ° °Patient Saturations on Room Air at Rest =87% ° °Patient Saturations on Room Air while Ambulating 52 feet = 80%  audible weezing ° °Patient Saturations on 2 Liters of oxygen while Ambulating = 90% ° °Please briefly explain why patient needs home oxygen:  Pt requires supplemental oxygen to achieve therapeutic levels. °  °Recommendations for follow up therapy are one component of a multi-disciplinary discharge planning process, led by the attending physician.  Recommendations may be updated based on patient status, additional functional criteria and insurance authorization. ° °Follow Up Recommendations ° Follow physician's recommendations for discharge plan and follow up therapies °  °  °Assistance Recommended at Discharge Frequent or constant Supervision/Assistance  °Patient can return home with the following A lot of help with  walking and/or transfers;A lot of help with bathing/dressing/bathroom;Assistance with cooking/housework;Help with stairs or ramp for entrance °  °Equipment Recommendations ° Rolling walker (2 wheels)  °  °Recommendations for Other Services   ° ° °  °Precautions / Restrictions Precautions °Precautions: Fall °Precaution Comments: monitor sats °Restrictions °Weight Bearing Restrictions: No °RLE Weight Bearing: Weight bearing as tolerated  °  ° °Mobility ° Bed Mobility °Overal bed mobility: Needs Assistance °Bed Mobility: Supine to Sit °  °  °Supine to sit: Min assist °  °  °General bed mobility comments: Pt OOB in recliner °  ° °Transfers °Overall transfer level: Needs assistance °Equipment used: Rolling walker (2 wheels) °Transfers: Sit to/from Stand, Bed to chair/wheelchair/BSC °Sit to Stand: Min assist °Stand pivot transfers: Min assist, Mod assist °  °  °  °  °General transfer comment: required Min/Mod Assist with 75% VC's on proper hand placement, safety with turns and proper walker to self distance. °  ° °Ambulation/Gait °Ambulation/Gait assistance: Min assist, Mod assist °Gait Distance (Feet): 52 Feet °Assistive device: Rolling walker (2 wheels) °Gait Pattern/deviations: Step-to pattern, Staggering left, Staggering right, Trunk flexed °Gait velocity: decreased °  °  °General Gait Details: VERY unsteady DRUNKEN gait requiring 75% VC's on proper upright posture.  Also required 2 lts nasal to achieve sats > 90%. Trial RA decreased to 80%. ° ° °Stairs °Stairs: Yes °Stairs assistance: Mod assist °Stair Management: Two rails, Step to pattern, Forwards °Number of Stairs: 2 °General stair comments: 75% VC's on proper tech and hand placement as well as sequencing. ° ° °Wheelchair Mobility °  ° °Modified Rankin (Stroke Patients Only) °  ° ° °  °  Balance                                            Cognition Arousal/Alertness: Awake/alert   Overall Cognitive Status: Impaired/Different from baseline                                  General Comments: AxO x 2 very "drunk" slurred speech, unsteady, laughing, distracted.  (pt was given 43m IV Dauladid at 8:21) for pain        Exercises      General Comments        Pertinent Vitals/Pain Pain Assessment Pain Assessment: Faces Faces Pain Scale: Hurts a little bit Pain Location: R hip Pain Descriptors / Indicators: Aching, Guarding, Operative site guarding Pain Intervention(s): Monitored during session, Premedicated before session, Repositioned, Ice applied    Home Living                          Prior Function            PT Goals (current goals can now be found in the care plan section) Progress towards PT goals: Progressing toward goals    Frequency    7X/week      PT Plan Current plan remains appropriate    Co-evaluation              AM-PAC PT "6 Clicks" Mobility   Outcome Measure  Help needed turning from your back to your side while in a flat bed without using bedrails?: A Lot Help needed moving from lying on your back to sitting on the side of a flat bed without using bedrails?: A Lot Help needed moving to and from a bed to a chair (including a wheelchair)?: A Lot Help needed standing up from a chair using your arms (e.g., wheelchair or bedside chair)?: Total Help needed to walk in hospital room?: Total Help needed climbing 3-5 steps with a railing? : Total 6 Click Score: 9    End of Session Equipment Utilized During Treatment: Gait belt;Oxygen Activity Tolerance: Patient limited by lethargy Patient left: with family/visitor present Nurse Communication: Mobility status PT Visit Diagnosis: Other abnormalities of gait and mobility (R26.89);Muscle weakness (generalized) (M62.81);Pain Pain - Right/Left: Right Pain - part of body: Hip     Time: 15038-8828PT Time Calculation (min) (ACUTE ONLY): 25 min  Charges:  $Gait Training: 8-22 mins $Therapeutic Activity: 8-22  mins                     LRica Koyanagi PTA Acute  Rehabilitation Services Pager      [phone]      [phone]

## 2021-08-26 NOTE — TOC Transition Note (Signed)
Transition of Care [facility]) - CM/SW Discharge Note  Patient Details  Name: Yvonne Campbell MRN: [id number] Date of Birth: 1950/09/06  Transition of Care [facility]) CM/SW Contact:  Sherie Don, LCSW Phone Number: 08/26/2021, 2:08 PM  Clinical Narrative: Patient is expected to discharge home after working with PT. CSW met with patient to confirm discharge plan and needs. Patient will discharge home with a home exercise program (HEP). Patient will need a rolling walker. [employer] delivered walker to patient's room. TOC signing off.  Final next level of care: Home/Self Care Barriers to Discharge: No Barriers Identified  Patient Goals and CMS Choice Patient states their goals for this hospitalization and ongoing recovery are:: Discharge home with HEP CMS Medicare.gov Compare Post Acute Care list provided to:: Patient Choice offered to / list presented to : Patient  Discharge Plan and Services         DME Arranged: Walker rolling DME Agency: [employer] Representative spoke with at DME Agency: Prearranged in orthopedist's office  Readmission Risk Interventions No flowsheet data found.

## 2021-08-26 NOTE — Progress Notes (Signed)
° °  Subjective: 1 Day Post-Op Procedure(s) (LRB): TOTAL HIP ARTHROPLASTY ANTERIOR APPROACH (Right) Patient reports pain as moderate.   Patient seen in rounds by Dr. Alvan Dame. Patient is resting in bed on exam this morning. She reports some groin pain, but improved since yesterday. She has only been taking 1 tablet of pain medication each dose.  We will continue therapy today.   Objective: Vital signs in last 24 hours: Temp:  [96 F (35.6 C)-99 F (37.2 C)] 98.8 F (37.1 C) (02/01 0624) Pulse Rate:  [51-82] 69 (02/01 0624) Resp:  [10-23] 18 (02/01 0624) BP: (88-141)/(55-79) 103/59 (02/01 0624) SpO2:  [91 %-100 %] 93 % (02/01 0624) Weight:  [69.9 kg] 69.9 kg (01/31 0943)  Intake/Output from previous day:  Intake/Output Summary (Last 24 hours) at 08/26/2021 0726 Last data filed at 08/26/2021 0308 Gross per 24 hour  Intake 3764.81 ml  Output 1000 ml  Net 2764.81 ml     Intake/Output this shift: No intake/output data recorded.  Labs: Recent Labs    08/26/21 0329  HGB 10.3*   Recent Labs    08/26/21 0329  WBC 8.5  RBC 3.45*  HCT 32.5*  PLT 189   Recent Labs    08/26/21 0329  NA 135  K 4.2  CL 104  CO2 27  BUN 9  CREATININE 1.04*  GLUCOSE 124*  CALCIUM 8.5*   No results for input(s): LABPT, INR in the last 72 hours.  Exam: General - Patient is Alert and Oriented Extremity - Neurologically intact Sensation intact distally Intact pulses distally Dorsiflexion/Plantar flexion intact Dressing - dressing C/D/I Motor Function - intact, moving foot and toes well on exam.   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    Essential hypertension   Impaired glucose tolerance    Left ventricular dysfunction 08/29/2018   Left ventricular dysfunction   Leukemia (HCC)    LVH (left ventricular hypertrophy)    Menopause    Paresthesia of hand    left   Pneumonia of right lower lobe due to infectious organism 10/23/2018   Pre-diabetes    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   Primary osteoarthritis of right hip 05/01/2018   Severe mitral regurgitation 08/29/2018   Mitral regurgitation   Stroke [facility])    Tobacco abuse 08/29/2018   Tobacco abuse    Assessment/Plan: 1 Day Post-Op Procedure(s) (LRB): TOTAL HIP ARTHROPLASTY ANTERIOR APPROACH (Right) Principal Problem:   S/P total right hip arthroplasty  Estimated body mass index is 27.28 kg/m as calculated from the following:   Height as of this encounter: 5\' 3"  (1.6 m).   Weight as of this encounter: 69.9 kg. Advance diet Up with therapy D/C IV fluids  DVT Prophylaxis - Aspirin Weight bearing as tolerated.  Plan is to go Home after hospital stay. Plan to get up with PT today. If she is meeting her goals and pain is controlled, we will plan for discharge home today. Follow up in the office in 2 weeks.   Griffith Citron, PA-C Orthopedic Surgery [phone] 08/26/2021, 7:26 AM

## 2021-08-27 ENCOUNTER — Inpatient Hospital Stay (HOSPITAL_COMMUNITY): Payer: Medicare Other

## 2021-08-27 DIAGNOSIS — M1611 Unilateral primary osteoarthritis, right hip: Secondary | ICD-10-CM | POA: Diagnosis present

## 2021-08-27 DIAGNOSIS — I110 Hypertensive heart disease with heart failure: Secondary | ICD-10-CM | POA: Diagnosis present

## 2021-08-27 DIAGNOSIS — E119 Type 2 diabetes mellitus without complications: Secondary | ICD-10-CM | POA: Diagnosis present

## 2021-08-27 DIAGNOSIS — I340 Nonrheumatic mitral (valve) insufficiency: Secondary | ICD-10-CM | POA: Diagnosis present

## 2021-08-27 DIAGNOSIS — F32A Depression, unspecified: Secondary | ICD-10-CM | POA: Diagnosis present

## 2021-08-27 DIAGNOSIS — Z9049 Acquired absence of other specified parts of digestive tract: Secondary | ICD-10-CM | POA: Diagnosis not present

## 2021-08-27 DIAGNOSIS — Z8673 Personal history of transient ischemic attack (TIA), and cerebral infarction without residual deficits: Secondary | ICD-10-CM | POA: Diagnosis not present

## 2021-08-27 DIAGNOSIS — F17210 Nicotine dependence, cigarettes, uncomplicated: Secondary | ICD-10-CM | POA: Diagnosis present

## 2021-08-27 DIAGNOSIS — Z7951 Long term (current) use of inhaled steroids: Secondary | ICD-10-CM | POA: Diagnosis not present

## 2021-08-27 DIAGNOSIS — Z856 Personal history of leukemia: Secondary | ICD-10-CM | POA: Diagnosis not present

## 2021-08-27 DIAGNOSIS — I5022 Chronic systolic (congestive) heart failure: Secondary | ICD-10-CM | POA: Diagnosis present

## 2021-08-27 DIAGNOSIS — F419 Anxiety disorder, unspecified: Secondary | ICD-10-CM | POA: Diagnosis present

## 2021-08-27 DIAGNOSIS — Z91040 Latex allergy status: Secondary | ICD-10-CM | POA: Diagnosis not present

## 2021-08-27 DIAGNOSIS — Z96611 Presence of right artificial shoulder joint: Secondary | ICD-10-CM | POA: Diagnosis present

## 2021-08-27 DIAGNOSIS — R0902 Hypoxemia: Secondary | ICD-10-CM | POA: Diagnosis not present

## 2021-08-27 DIAGNOSIS — Z882 Allergy status to sulfonamides status: Secondary | ICD-10-CM | POA: Diagnosis not present

## 2021-08-27 DIAGNOSIS — Z880 Allergy status to penicillin: Secondary | ICD-10-CM | POA: Diagnosis not present

## 2021-08-27 DIAGNOSIS — Z803 Family history of malignant neoplasm of breast: Secondary | ICD-10-CM | POA: Diagnosis not present

## 2021-08-27 DIAGNOSIS — Z79899 Other long term (current) drug therapy: Secondary | ICD-10-CM | POA: Diagnosis not present

## 2021-08-27 DIAGNOSIS — J449 Chronic obstructive pulmonary disease, unspecified: Secondary | ICD-10-CM | POA: Diagnosis present

## 2021-08-27 DIAGNOSIS — Z8249 Family history of ischemic heart disease and other diseases of the circulatory system: Secondary | ICD-10-CM | POA: Diagnosis not present

## 2021-08-27 LAB — CBC
HCT: 31.9 % — ABNORMAL LOW (ref 36.0–46.0)
Hemoglobin: 10.2 g/dL — ABNORMAL LOW (ref 12.0–15.0)
MCH: 29.6 pg (ref 26.0–34.0)
MCHC: 32 g/dL (ref 30.0–36.0)
MCV: 92.5 fL (ref 80.0–100.0)
Platelets: 182 K/uL (ref 150–400)
RBC: 3.45 MIL/uL — ABNORMAL LOW (ref 3.87–5.11)
RDW: 13.6 % (ref 11.5–15.5)
WBC: 13.9 K/uL — ABNORMAL HIGH (ref 4.0–10.5)
nRBC: 0 % (ref 0.0–0.2)

## 2021-08-27 MED ORDER — ORAL CARE MOUTH RINSE
15.0000 mL | Freq: Two times a day (BID) | OROMUCOSAL | Status: DC
  Administered 2021-08-27: 15 mL via OROMUCOSAL

## 2021-08-27 NOTE — Plan of Care (Signed)
Patient discharged home via family private vehicle. Discharge instructions provided to patient in-person. Discharge instructions provided to daughter Chanel via phone. Patient and family verbalized understanding of importance of follow-up with PCP within one week. Ivan Anchors, RN 08/27/21 4:03 PM

## 2021-08-27 NOTE — Progress Notes (Signed)
Subjective: 2 Days Post-Op Procedure(s) (LRB): TOTAL HIP ARTHROPLASTY ANTERIOR APPROACH (Right) Patient reports pain as mild.   Patient seen in rounds for Dr. Alvan Dame. Patient is doing much better this afternoon. Her breathing improved significantly, and she denies CP/SHOB. She reports the hip is feeling well today, and she did well with PT.  We will continue therapy today.   Objective: Vital signs in last 24 hours: Temp:  [98.1 F (36.7 C)-99.3 F (37.4 C)] 98.5 F (36.9 C) (02/02 1341) Pulse Rate:  [51-63] 63 (02/02 1341) Resp:  [16-18] 18 (02/02 1341) BP: (109-129)/(53-62) 121/56 (02/02 1341) SpO2:  [88 %-94 %] 91 % (02/02 1341)  Intake/Output from previous day:  Intake/Output Summary (Last 24 hours) at 08/27/2021 1407 Last data filed at 08/27/2021 1300 Gross per 24 hour  Intake 1711.4 ml  Output 2600 ml  Net -888.6 ml     Intake/Output this shift: Total I/O In: 991.4 [P.O.:480; I.V.:511.4] Out: 700 [Urine:700]  Labs: Recent Labs    08/26/21 0329 08/27/21 0332  HGB 10.3* 10.2*   Recent Labs    08/26/21 0329 08/27/21 0332  WBC 8.5 13.9*  RBC 3.45* 3.45*  HCT 32.5* 31.9*  PLT 189 182   Recent Labs    08/26/21 0329  NA 135  K 4.2  CL 104  CO2 27  BUN 9  CREATININE 1.04*  GLUCOSE 124*  CALCIUM 8.5*   No results for input(s): LABPT, INR in the last 72 hours.  Exam: General - Patient is Alert and Oriented Extremity - Neurologically intact Sensation intact distally Intact pulses distally Dorsiflexion/Plantar flexion intact Dressing - dressing C/D/I Motor Function - intact, moving foot and toes well on exam.   Past Medical History:  Diagnosis Date   Acute bronchitis    Anemia    Anxiety    CHF (congestive heart failure) (HCC)    COPD (chronic obstructive pulmonary disease) (HCC)    Drug use    Epistaxis 11/22/2018   GERD (gastroesophageal reflux disease)    History of vitamin D deficiency    Hyperlipidemia    Hypertension    Hypertensive  heart disease with heart failure ([place]) 08/29/2018   Essential hypertension   Impaired glucose tolerance    Left ventricular dysfunction 08/29/2018   Left ventricular dysfunction   Leukemia (HCC)    LVH (left ventricular hypertrophy)    Menopause    Paresthesia of hand    left   Pneumonia of right lower lobe due to infectious organism 10/23/2018   Pre-diabetes    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   Primary osteoarthritis of right hip 05/01/2018   Severe mitral regurgitation 08/29/2018   Mitral regurgitation   Stroke [facility])    Tobacco abuse 08/29/2018   Tobacco abuse    Assessment/Plan: 2 Days Post-Op Procedure(s) (LRB): TOTAL HIP ARTHROPLASTY ANTERIOR APPROACH (Right) Principal Problem:   S/P total right hip arthroplasty  Estimated body mass index is 27.28 kg/m as calculated from the following:   Height as of this encounter: 5\' 3"  (1.6 m).   Weight as of this encounter: 69.9 kg. Advance diet Up with therapy D/C IV fluids  DVT Prophylaxis - Aspirin Weight bearing as tolerated.  Hx of COPD and CHF. Difficulty with hypoxia and cough yesterday, now improved. O2 sat most recently at 91%. Patient denies SHOB, chest pain. She has been using the incentive spirometer. She is afebrile.   CXR showed: New left lower lobe consolidation concerning for pneumonia. Probable small left pleural effusion.  I will reach out to her PCP regarding whether this requires ABX treatment. She will follow up with PCP in the next 1-2 weeks for recheck.  Plan is to go Home after hospital stay. Plan for discharge home after 1-2 sessions if meeting her goals. Follow up in the office in 2 weeks.   Griffith Citron, PA-C Orthopedic Surgery [phone] 08/27/2021, 2:07 PM

## 2021-08-27 NOTE — Progress Notes (Addendum)
Physical Therapy Treatment Patient Details Name: Yvonne Campbell MRN: [id number] DOB: July 13, 1951 Today's Date: 08/27/2021   History of Present Illness Pt is 71 yo female s/p R anterior THA on 08/25/21.  Pt with hx including but not limited to HTN, HLD, drug and tobacco use, DM, PNE, OA, R reverse TSA    PT Comments    POD # 2 am session General Comments: much improved cognition and level of alertness.  Appears at prior level which is energetic and spirited. Pt self able to get OOB.  General transfer comment: improved balance and cognition, self able to rise from bed and toilet self including self peri care. General Gait Details: MUCH improved gait with increased tolerance, distance and safe balance.  Also amb on RA sats decreased to upper 80"s.  SATURATION QUALIFICATIONS: (This note is used to comply with regulatory documentation for home oxygen)  Patient Saturations on Room Air at Rest =91%  Patient Saturations on Room Air while Ambulating = 88%  Pt has a Hx COPD and believe this to be her baseline  Pt HAS met her mobility goals to safely D/C to home today.    Recommendations for follow up therapy are one component of a multi-disciplinary discharge planning process, led by the attending physician.  Recommendations may be updated based on patient status, additional functional criteria and insurance authorization.  Follow Up Recommendations  Follow physician's recommendations for discharge plan and follow up therapies     Assistance Recommended at Discharge Frequent or constant Supervision/Assistance  Patient can return home with the following A lot of help with walking and/or transfers;A lot of help with bathing/dressing/bathroom;Assistance with cooking/housework;Help with stairs or ramp for entrance   Equipment Recommendations  Rolling walker (2 wheels)    Recommendations for Other Services       Precautions / Restrictions Precautions Precautions: Fall Precaution Comments:  monitor sats Restrictions Weight Bearing Restrictions: No RLE Weight Bearing: Weight bearing as tolerated     Mobility  Bed Mobility Overal bed mobility: Needs Assistance Bed Mobility: Supine to Sit     Supine to sit: Supervision     General bed mobility comments: self able with increased time    Transfers Overall transfer level: Needs assistance Equipment used: Rolling walker (2 wheels) Transfers: Sit to/from Stand Sit to Stand: Supervision           General transfer comment: improved balance and cognition, self able to rise from bed and toilet self including self peri care.    Ambulation/Gait Ambulation/Gait assistance: Supervision Gait Distance (Feet): 250 Feet Assistive device: Rolling walker (2 wheels) Gait Pattern/deviations: Step-through pattern Gait velocity: decreased     General Gait Details: MUCH improved gait with increased tolerance, distance and safe balance.  Also amb on RA sats decreased to upper 80"s.   Stairs Stairs: Yes Stairs assistance: Supervision Stair Management: Two rails, Forwards Number of Stairs: 8 General stair comments: only one initial VC on safety and proper sequencing.  Tolerated well.   Wheelchair Mobility    Modified Rankin (Stroke Patients Only)       Balance                                            Cognition Arousal/Alertness: Awake/alert Behavior During Therapy: WFL for tasks assessed/performed Overall Cognitive Status: Within Functional Limits for tasks assessed  General Comments: much improved cognition and level of alertness.  Appears at prior level which is energetic and spirited.        Exercises      General Comments        Pertinent Vitals/Pain Pain Assessment Pain Assessment: 0-10 Pain Score: 3  Pain Location: R hip Pain Descriptors / Indicators: Aching, Guarding, Operative site guarding Pain Intervention(s): Monitored  during session, Premedicated before session, Repositioned, Ice applied    Home Living                          Prior Function            PT Goals (current goals can now be found in the care plan section) Progress towards PT goals: Progressing toward goals    Frequency    7X/week      PT Plan Current plan remains appropriate    Co-evaluation              AM-PAC PT "6 Clicks" Mobility   Outcome Measure  Help needed turning from your back to your side while in a flat bed without using bedrails?: None Help needed moving from lying on your back to sitting on the side of a flat bed without using bedrails?: None Help needed moving to and from a bed to a chair (including a wheelchair)?: A Little Help needed standing up from a chair using your arms (e.g., wheelchair or bedside chair)?: A Little Help needed to walk in hospital room?: A Little Help needed climbing 3-5 steps with a railing? : A Little 6 Click Score: 20    End of Session Equipment Utilized During Treatment: Gait belt;Oxygen Activity Tolerance: Patient tolerated treatment well Patient left: in chair Nurse Communication: Mobility status PT Visit Diagnosis: Other abnormalities of gait and mobility (R26.89);Muscle weakness (generalized) (M62.81);Pain Pain - Right/Left: Right Pain - part of body: Hip     Time: 3685-9923 PT Time Calculation (min) (ACUTE ONLY): 28 min  Charges:  $Gait Training: 8-22 mins $Therapeutic Activity: 8-22 mins                     {Jassiah Viviano  PTA Acute  Rehabilitation Services Pager      [phone] Office      [phone]

## 2021-08-27 NOTE — Plan of Care (Signed)
Problem: Education: Goal: Knowledge of the prescribed therapeutic regimen will improve Outcome: Progressing   Problem: Education: Goal: Understanding of discharge needs will improve Outcome: Progressing   Problem: Activity: Goal: Ability to avoid complications of mobility impairment will improve Outcome: [place], RN 08/27/21 10:30 AM

## 2021-09-10 NOTE — Discharge Summary (Signed)
Physician Discharge Summary   Patient ID: Yvonne Campbell MRN: [id number] DOB/AGE: [birth year] 71 y.o.  Admit date: 08/25/2021 Discharge date: 08/27/2021  Primary Diagnosis: Right hip osteoarthritis.   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   Essential hypertension   Impaired glucose tolerance    Left ventricular dysfunction 08/29/2018   Left ventricular dysfunction   Leukemia (HCC)    LVH (left ventricular hypertrophy)    Menopause    Paresthesia of hand    left   Pneumonia of right lower lobe due to infectious organism 10/23/2018   Pre-diabetes    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   Weight as of this encounter: 69.9 kg.  Procedure:  Procedure(s) (LRB): TOTAL HIP ARTHROPLASTY ANTERIOR APPROACH (Right)   Consults: None  HPI:  Yvonne Campbell is a 71 y.o. female who had   presented to office for evaluation of right hip pain.  Radiographs revealed   progressive degenerative changes with bone-on-bone   articulation of the  hip joint, including subchondral cystic changes and osteophytes.  The patient had painful limited range of   motion significantly affecting their overall quality of life and function.  The patient was  failing to    respond to conservative measures including medications and/or injections and activity modification and at this point was ready   to proceed with more definitive measures.  Consent was obtained for   benefit of pain relief.  Specific risks of infection, DVT, component   failure, dislocation, neurovascular injury, and need for revision surgery were reviewed in the office as well discussion of   the anterior versus posterior approach were reviewed.  Laboratory Data: Admission on 08/25/2021, Discharged on 08/27/2021  Component Date Value Ref Range Status   ABO/RH(D) 08/25/2021 O POS   Final   Antibody Screen 08/25/2021 NEG   Final   Sample Expiration 08/25/2021    Final                   Value:08/28/2021,2359 Performed at [facility], [place] [address], [place], [place] [address]    ABO/RH(D) 08/25/2021    Final                   Value:O POS Performed at [facility], [place] [address] [place], [place] [address]    WBC 08/26/2021 8.5  4.0 - 10.5 K/uL Final   RBC 08/26/2021 3.45 (L)  3.87 - 5.11 MIL/uL Final   Hemoglobin 08/26/2021 10.3 (L)  12.0 - 15.0 g/dL Final   HCT 08/26/2021 32.5 (L)  36.0 - 46.0 % Final   MCV 08/26/2021 94.2  80.0 -  100.0 fL Final   MCH 08/26/2021 29.9  26.0 - 34.0 pg Final   MCHC 08/26/2021 31.7  30.0 - 36.0 g/dL Final   RDW 08/26/2021 13.6  11.5 - 15.5 % Final   Platelets 08/26/2021 189  150 - 400 K/uL Final   nRBC 08/26/2021 0.0  0.0 - 0.2 % Final   Performed at [facility], [place] [address], [place], [place] [address]   Sodium 08/26/2021 135  135 - 145 mmol/L Final   Potassium 08/26/2021 4.2  3.5 - 5.1 mmol/L Final   Chloride 08/26/2021 104  98 - 111 mmol/L Final   CO2 08/26/2021 27  22 - 32 mmol/L Final   Glucose, Bld 08/26/2021 124 (H)  70 - 99 mg/dL Final   Glucose reference range applies only to samples taken after fasting for at least 8 hours.   BUN 08/26/2021 9  8 - 23 mg/dL Final    Creatinine, Ser 08/26/2021 1.04 (H)  0.44 - 1.00 mg/dL Final   Calcium 08/26/2021 8.5 (L)  8.9 - 10.3 mg/dL Final   GFR, Estimated 08/26/2021 57 (L)  >60 mL/min Final   Comment: (NOTE) Calculated using the CKD-EPI Creatinine Equation (2021)    Anion gap 08/26/2021 4 (L)  5 - 15 Final   Performed at [facility], [place] [address], [place], [place] [address]   WBC 08/27/2021 13.9 (H)  4.0 - 10.5 K/uL Final   RBC 08/27/2021 3.45 (L)  3.87 - 5.11 MIL/uL Final   Hemoglobin 08/27/2021 10.2 (L)  12.0 - 15.0 g/dL Final   HCT 08/27/2021 31.9 (L)  36.0 - 46.0 % Final   MCV 08/27/2021 92.5  80.0 - 100.0 fL Final   MCH 08/27/2021 29.6  26.0 - 34.0 pg Final   MCHC 08/27/2021 32.0  30.0 - 36.0 g/dL Final   RDW 08/27/2021 13.6  11.5 - 15.5 % Final   Platelets 08/27/2021 182  150 - 400 K/uL Final   nRBC 08/27/2021 0.0  0.0 - 0.2 % Final   Performed at [facility], [place] [address] [place], [place] [address]  Hospital Outpatient Visit on 08/21/2021  Component Date Value Ref Range Status   SARS Coronavirus 2 08/21/2021 NEGATIVE  NEGATIVE Final   Comment: (NOTE) SARS-CoV-2 target nucleic acids are NOT DETECTED.  The SARS-CoV-2 RNA is generally detectable in upper and lower respiratory specimens during the acute phase of infection. Negative results do not preclude SARS-CoV-2 infection, do not rule out co-infections with other pathogens, and should not be used as the sole basis for treatment or other patient management decisions. Negative results must be combined with clinical observations, patient history, and epidemiological information. The expected result is Negative.  Fact Sheet for Patients: SugarRoll.be  Fact Sheet for Healthcare Providers: https://www.woods-mathews.com/  This test is not yet approved or cleared by the [place] FDA and  has been authorized for detection and/or diagnosis of SARS-CoV-2 by FDA  under an Emergency Use Authorization (EUA). This EUA will remain  in effect (meaning this test can be used) for the duration of the COVID-19 declaration under Se                          ction 564(b)(1) of the Act, 21 U.S.C. section 360bbb-3(b)(1), unless the authorization is terminated or revoked sooner.  Performed at [facility], [place] [address] [place], [place] [address]   Hospital Outpatient Visit on 08/05/2021  Component Date  Value Ref Range Status   MRSA, PCR 08/05/2021 NEGATIVE  NEGATIVE Final   Staphylococcus aureus 08/05/2021 NEGATIVE  NEGATIVE Final   Comment: (NOTE) The Xpert SA Assay (FDA approved for NASAL specimens in patients 39 years of age and older), is one component of a comprehensive surveillance program. It is not intended to diagnose infection nor to guide or monitor treatment. Performed at [facility], [place] [address] [place], [place] [address]    WBC 08/05/2021 5.3  4.0 - 10.5 K/uL Final   RBC 08/05/2021 4.25  3.87 - 5.11 MIL/uL Final   Hemoglobin 08/05/2021 12.6  12.0 - 15.0 g/dL Final   HCT 08/05/2021 38.9  36.0 - 46.0 % Final   MCV 08/05/2021 91.5  80.0 - 100.0 fL Final   MCH 08/05/2021 29.6  26.0 - 34.0 pg Final   MCHC 08/05/2021 32.4  30.0 - 36.0 g/dL Final   RDW 08/05/2021 14.1  11.5 - 15.5 % Final   Platelets 08/05/2021 245  150 - 400 K/uL Final   nRBC 08/05/2021 0.0  0.0 - 0.2 % Final   Performed at [facility], [place] [address], [place], [place] [address]   Sodium 08/05/2021 138  135 - 145 mmol/L Final   Potassium 08/05/2021 3.8  3.5 - 5.1 mmol/L Final   Chloride 08/05/2021 103  98 - 111 mmol/L Final   CO2 08/05/2021 27  22 - 32 mmol/L Final   Glucose, Bld 08/05/2021 106 (H)  70 - 99 mg/dL Final   Glucose reference range applies only to samples taken after fasting for at least 8 hours.   BUN 08/05/2021 14  8 - 23 mg/dL Final   Creatinine, Ser 08/05/2021 1.09 (H)  0.44 - 1.00 mg/dL Final   Calcium  08/05/2021 8.7 (L)  8.9 - 10.3 mg/dL Final   Total Protein 08/05/2021 7.5  6.5 - 8.1 g/dL Final   Albumin 08/05/2021 3.3 (L)  3.5 - 5.0 g/dL Final   AST 08/05/2021 15  15 - 41 U/L Final   ALT 08/05/2021 12  0 - 44 U/L Final   Alkaline Phosphatase 08/05/2021 79  38 - 126 U/L Final   Total Bilirubin 08/05/2021 0.7  0.3 - 1.2 mg/dL Final   GFR, Estimated 08/05/2021 54 (L)  >60 mL/min Final   Comment: (NOTE) Calculated using the CKD-EPI Creatinine Equation (2021)    Anion gap 08/05/2021 8  5 - 15 Final   Performed at [facility], [place] [address] [place], [place] [address]   Hgb A1c MFr Bld 08/05/2021 5.9 (H)  4.8 - 5.6 % Final   Comment: (NOTE) Pre diabetes:          5.7%-6.4%  Diabetes:              >6.4%  Glycemic control for   <7.0% adults with diabetes    Mean Plasma Glucose 08/05/2021 122.63  mg/dL Final   Performed at [facility], [place] [address], [place], [place] [address]   Glucose-Capillary 08/05/2021 103 (H)  70 - 99 mg/dL Final   Glucose reference range applies only to samples taken after fasting for at least 8 hours.     X-Rays:DG Pelvis Portable  Result Date: 08/25/2021 CLINICAL DATA:  Right hip arthroplasty EXAM: PORTABLE PELVIS 1-2 VIEWS COMPARISON:  None. FINDINGS: There is evidence of recent right hip arthroplasty. There are pockets of air in the soft tissues adjacent to the right hip. No recent fracture is seen. IMPRESSION: Status  post right hip arthroplasty. Electronically Signed   By: Elmer Picker M.D.   On: 08/25/2021 14:39   DG Chest Port 1 View  Result Date: 08/27/2021 CLINICAL DATA:  Postop hip arthroplasty, dyspnea EXAM: PORTABLE CHEST 1 VIEW COMPARISON:  05/07/2021 FINDINGS: Unchanged cardiomediastinal silhouette. There is a new left lower lobe consolidation. The right lung is clear. No pneumothorax. Probable small left pleural effusion. Prior right reverse total shoulder arthroplasty. No acute osseous abnormality. Left shoulder  degenerative changes. Right upper quadrant surgical clips. IMPRESSION: New left lower lobe consolidation concerning for pneumonia. Probable small left pleural effusion. Electronically Signed   By: Maurine Simmering M.D.   On: 08/27/2021 11:16   DG C-Arm 1-60 Min-No Report  Result Date: 08/25/2021 Fluoroscopy was utilized by the requesting physician.  No radiographic interpretation.   DG C-Arm 1-60 Min-No Report  Result Date: 08/25/2021 Fluoroscopy was utilized by the requesting physician.  No radiographic interpretation.   DG HIP OPERATIVE UNILAT W OR W/O PELVIS RIGHT  Result Date: 08/25/2021 CLINICAL DATA:  Hip replacement EXAM: OPERATIVE RIGHT HIP (WITH PELVIS IF PERFORMED) 1 VIEW TECHNIQUE: Fluoroscopic spot image(s) were submitted for interpretation post-operatively. COMPARISON:  None. FINDINGS: Post right total hip arthroplasty with single acetabular screw. IMPRESSION: Post right total hip arthroplasty. Electronically Signed   By: Macy Mis M.D.   On: 08/25/2021 12:59    EKG: Orders placed or performed in visit on 07/02/20   EKG 12-Lead     Hospital Course: [place] Jerilynn Mages Jacko is a 71 y.o. who was admitted to [facility]. They were brought to the operating room on 08/25/2021 and underwent Procedure(s): [place].  Patient tolerated the procedure well and was later transferred to the recovery room and then to the orthopaedic floor for postoperative care. They were given PO and IV analgesics for pain control following their surgery. They were given 24 hours of postoperative antibiotics of  Anti-infectives (From admission, onward)    Start     Dose/Rate Route Frequency Ordered Stop   08/25/21 1800  ceFAZolin (ANCEF) IVPB 2g/100 mL premix        2 g 200 mL/hr over 30 Minutes Intravenous Every 6 hours 08/25/21 1628 08/26/21 0019   08/25/21 0930  ceFAZolin (ANCEF) IVPB 2g/100 mL premix        2 g 200 mL/hr over 30 Minutes Intravenous On call to O.R.  08/25/21 0915 08/25/21 1121      and started on DVT prophylaxis in the form of Aspirin.   PT and OT were ordered for total joint protocol. Discharge planning consulted to help with postop disposition and equipment needs. Patient had a good night on the evening of surgery. They started to get up OOB with therapy on POD #0. She was having difficulty with SHOB and cough on the evening of POD #1. She has a history of COPD and CHF, so we obtained a CXR. By morning, she was improved with her O2 sats and all pulmonary symptoms had resolved. CXR showed possible new left lower lobe infiltrate. I discussed with her PCP, and scheduled follow up within 1 week. Continued to work with therapy into POD #2. Pt was seen during rounds on day two and was ready to go home pending progress with therapy. Pt worked with therapy for one additional session and was meeting their goals. She was discharged to home later that day in stable condition.  Diet: Regular diet Activity: WBAT Follow-up: in 2 weeks Disposition: Home Discharged Condition:  good   Discharge Instructions     Call MD / Call 911   Complete by: As directed    If you experience chest pain or shortness of breath, CALL 911 and be transported to the hospital emergency room.  If you develope a fever above 101 F, pus (white drainage) or increased drainage or redness at the wound, or calf pain, call your surgeon's office.   Call MD / Call 911   Complete by: As directed    If you experience chest pain or shortness of breath, CALL 911 and be transported to the hospital emergency room.  If you develope a fever above 101 F, pus (white drainage) or increased drainage or redness at the wound, or calf pain, call your surgeon's office.   Change dressing   Complete by: As directed    Maintain surgical dressing until follow up in the clinic. If the edges start to pull up, may reinforce with tape. If the dressing is no longer working, may remove and cover with gauze and  tape, but must keep the area dry and clean.  Call with any questions or concerns.   Change dressing   Complete by: As directed    Maintain surgical dressing until follow up in the clinic. If the edges start to pull up, may reinforce with tape. If the dressing is no longer working, may remove and cover with gauze and tape, but must keep the area dry and clean.  Call with any questions or concerns.   Constipation Prevention   Complete by: As directed    Drink plenty of fluids.  Prune juice may be helpful.  You may use a stool softener, such as Colace (over the counter) 100 mg twice a day.  Use MiraLax (over the counter) for constipation as needed.   Constipation Prevention   Complete by: As directed    Drink plenty of fluids.  Prune juice may be helpful.  You may use a stool softener, such as Colace (over the counter) 100 mg twice a day.  Use MiraLax (over the counter) for constipation as needed.   Diet - low sodium heart healthy   Complete by: As directed    Diet - low sodium heart healthy   Complete by: As directed    Increase activity slowly as tolerated   Complete by: As directed    Weight bearing as tolerated with assist device (walker, cane, etc) as directed, use it as long as suggested by your surgeon or therapist, typically at least 4-6 weeks.   Increase activity slowly as tolerated   Complete by: As directed    Weight bearing as tolerated with assist device (walker, cane, etc) as directed, use it as long as suggested by your surgeon or therapist, typically at least 4-6 weeks.   Post-operative opioid taper instructions:   Complete by: As directed    POST-OPERATIVE OPIOID TAPER INSTRUCTIONS: It is important to wean off of your opioid medication as soon as possible. If you do not need pain medication after your surgery it is ok to stop day one. Opioids include: Codeine, Hydrocodone(Norco, Vicodin), Oxycodone(Percocet, oxycontin) and hydromorphone amongst others.  Long term and even  short term use of opiods can cause: Increased pain response Dependence Constipation Depression Respiratory depression And more.  Withdrawal symptoms can include Flu like symptoms Nausea, vomiting And more Techniques to manage these symptoms Hydrate well Eat regular healthy meals Stay active Use relaxation techniques(deep breathing, meditating, yoga) Do Not substitute Alcohol to help with  tapering If you have been on opioids for less than two weeks and do not have pain than it is ok to stop all together.  Plan to wean off of opioids This plan should start within one week post op of your joint replacement. Maintain the same interval or time between taking each dose and first decrease the dose.  Cut the total daily intake of opioids by one tablet each day Next start to increase the time between doses. The last dose that should be eliminated is the evening dose.      Post-operative opioid taper instructions:   Complete by: As directed    POST-OPERATIVE OPIOID TAPER INSTRUCTIONS: It is important to wean off of your opioid medication as soon as possible. If you do not need pain medication after your surgery it is ok to stop day one. Opioids include: Codeine, Hydrocodone(Norco, Vicodin), Oxycodone(Percocet, oxycontin) and hydromorphone amongst others.  Long term and even short term use of opiods can cause: Increased pain response Dependence Constipation Depression Respiratory depression And more.  Withdrawal symptoms can include Flu like symptoms Nausea, vomiting And more Techniques to manage these symptoms Hydrate well Eat regular healthy meals Stay active Use relaxation techniques(deep breathing, meditating, yoga) Do Not substitute Alcohol to help with tapering If you have been on opioids for less than two weeks and do not have pain than it is ok to stop all together.  Plan to wean off of opioids This plan should start within one week post op of your joint  replacement. Maintain the same interval or time between taking each dose and first decrease the dose.  Cut the total daily intake of opioids by one tablet each day Next start to increase the time between doses. The last dose that should be eliminated is the evening dose.      TED hose   Complete by: As directed    Use stockings (TED hose) for 2 weeks on both leg(s).  You may remove them at night for sleeping.   TED hose   Complete by: As directed    Use stockings (TED hose) for 2 weeks on both leg(s).  You may remove them at night for sleeping.      Allergies as of 08/27/2021       Reactions   Antiseborrheic Products, Misc. Swelling   Latex Rash   Penicillins Swelling   Did it involve swelling of the face/tongue/throat, SOB, or low BP? Unknown Did it involve sudden or severe rash/hives, skin peeling, or any reaction on the inside of your mouth or nose? Unknown Did you need to seek medical attention at a hospital or doctor's office? Unknown When did it last happen?      unk If all above answers are "NO", may proceed with cephalosporin use. Tolerated Cephalosporin 08/25/21   Sulfa Antibiotics Itching, Rash        Medication List     STOP taking these medications    HYDROcodone-acetaminophen 5-325 MG tablet Commonly known as: Norco Replaced by: HYDROcodone-acetaminophen 7.5-325 MG tablet   traMADol 50 MG tablet Commonly known as: ULTRAM       TAKE these medications    aspirin 81 MG chewable tablet Chew 1 tablet (81 mg total) by mouth 2 (two) times daily for 28 days.   carvedilol 25 MG tablet Commonly known as: COREG Take 25 mg by mouth 2 (two) times daily.   docusate sodium 100 MG capsule Commonly known as: COLACE Take 1 capsule (100 mg total) by mouth  2 (two) times daily.   Entresto 97-103 MG Generic drug: sacubitril-valsartan Take 1 tablet by mouth 2 (two) times a day.   fluticasone furoate-vilanterol 100-25 MCG/ACT Aepb Commonly known as: BREO  ELLIPTA Inhale 1 puff into the lungs daily.   gabapentin 300 MG capsule Commonly known as: NEURONTIN Take 600 mg by mouth 2 (two) times daily.   HYDROcodone-acetaminophen 7.5-325 MG tablet Commonly known as: NORCO Take 1 tablet by mouth every 4 (four) hours as needed for severe pain (pain score 7-10). Replaces: HYDROcodone-acetaminophen 5-325 MG tablet   hydrOXYzine 25 MG tablet Commonly known as: ATARAX Take 25 mg by mouth daily as needed for anxiety.   meclizine 25 MG tablet Commonly known as: ANTIVERT Take 25 mg by mouth 3 (three) times daily as needed for dizziness.   omeprazole 40 MG capsule Commonly known as: PRILOSEC Take 40 mg by mouth.   polyethylene glycol 17 g packet Commonly known as: MIRALAX / GLYCOLAX Take 17 g by mouth daily as needed for mild constipation.   ProAir HFA 108 (90 Base) MCG/ACT inhaler Generic drug: albuterol Inhale 2 puffs into the lungs every 6 (six) hours as needed for wheezing or shortness of breath.   sodium chloride 0.65 % nasal spray Commonly known as: OCEAN Place 2 sprays into the nose as needed for congestion.   tiZANidine 4 MG tablet Commonly known as: ZANAFLEX Take 1 tablet (4 mg total) by mouth every 8 (eight) hours as needed for muscle spasms. What changed:  medication strength how much to take   venlafaxine 75 MG tablet Commonly known as: EFFEXOR Take 75 mg by mouth daily.               Discharge Care Instructions  (From admission, onward)           Start     Ordered   08/27/21 0000  Change dressing       Comments: Maintain surgical dressing until follow up in the clinic. If the edges start to pull up, may reinforce with tape. If the dressing is no longer working, may remove and cover with gauze and tape, but must keep the area dry and clean.  Call with any questions or concerns.   08/27/21 1539   08/26/21 0000  Change dressing       Comments: Maintain surgical dressing until follow up in the clinic. If the  edges start to pull up, may reinforce with tape. If the dressing is no longer working, may remove and cover with gauze and tape, but must keep the area dry and clean.  Call with any questions or concerns.   08/26/21 0730            Follow-up Information     Paralee Cancel, MD. Schedule an appointment as soon as possible for a visit in 2 week(s).   Specialty: Orthopedic Surgery Contact information: [address] [place] [address] [place] [address] [phone]         Windell Hummingbird, PA-C. Schedule an appointment as soon as possible for a visit in 1 week(s).   Specialty: Physician Assistant Why: Concern for possible COPD exacerbation/PNA per imaging in the hospital Contact information: [address] [place] [address] [phone]         Richardo Priest, MD .   Specialties: Cardiology, Radiology Contact information: [place] [address] [phone]                 Signed: Griffith Citron, PA-C  Orthopedic Surgery 09/10/2021, 9:52 AM

## 2021-12-23 NOTE — Progress Notes (Signed)
Pt. Needs orders for upcoming surgery. ?

## 2021-12-23 NOTE — Patient Instructions (Addendum)
DUE TO COVID-19 ONLY TWO VISITORS  (aged 72 and older)  ARE ALLOWED TO COME WITH YOU AND STAY IN THE WAITING ROOM ONLY DURING PRE OP AND PROCEDURE.   **NO VISITORS ARE ALLOWED IN THE SHORT STAY AREA OR RECOVERY ROOM!!**  IF YOU WILL BE ADMITTED INTO THE HOSPITAL YOU ARE ALLOWED ONLY FOUR SUPPORT PEOPLE DURING VISITATION HOURS ONLY (7 AM -8PM)   The support person(s) must pass our screening, gel in and out, and wear a mask at all times, including in the patient's room. Patients must also wear a mask when staff or their support person are in the room. Visitors GUEST BADGE MUST BE WORN VISIBLY  One adult visitor may remain with you overnight and MUST be in the room by 8 P.M.     Your procedure is scheduled on: 01/05/22   Report to [facility] Main Entrance    Report to admitting at : 8:45 AM   Call this number if you have problems the morning of surgery [phone]   Do not eat food :After Midnight.   After Midnight you may have the following liquids until : 8:30 AM DAY OF SURGERY  Water Black Coffee (sugar ok, NO MILK/CREAM OR CREAMERS)  Tea (sugar ok, NO MILK/CREAM OR CREAMERS) regular and decaf                             Plain Jell-O (NO RED)                                           Fruit ices (not with fruit pulp, NO RED)                                     Popsicles (NO RED)                                                                  Juice: apple, WHITE grape, WHITE cranberry Sports drinks like Gatorade (NO RED) Clear broth(vegetable,chicken,beef)   Oral Hygiene is also important to reduce your risk of infection.                                    Remember - BRUSH YOUR TEETH THE MORNING OF SURGERY WITH YOUR REGULAR TOOTHPASTE   Do NOT smoke after Midnight   Take these medicines the morning of surgery with A SIP OF WATER: gabapentin,venlafaxine,carvedilol,entresto,omeprazole.Use inhalers as usual.  DO NOT TAKE ANY ORAL DIABETIC MEDICATIONS DAY OF YOUR  SURGERY  Bring CPAP mask and tubing day of surgery.                              You may not have any metal on your body including hair pins, jewelry, and body piercing             Do not wear make-up, lotions, powders, perfumes/cologne, or deodorant  Do not wear  nail polish including gel and S&S, artificial/acrylic nails, or any other type of covering on natural nails including finger and toenails. If you have artificial nails, gel coating, etc. that needs to be removed by a nail salon please have this removed prior to surgery or surgery may need to be canceled/ delayed if the surgeon/ anesthesia feels like they are unable to be safely monitored.   Do not shave  48 hours prior to surgery.    Do not bring valuables to the hospital. [place].   Contacts, dentures or bridgework may not be worn into surgery.   Bring small overnight bag day of surgery.    Patients discharged on the day of surgery will not be allowed to drive home.  Someone NEEDS to stay with you for the first 24 hours after anesthesia.   Special Instructions: Bring a copy of your healthcare power of attorney and living will documents         the day of surgery if you haven't scanned them before.              Please read over the following fact sheets you were given: IF YOU HAVE QUESTIONS ABOUT YOUR PRE-OP INSTRUCTIONS PLEASE CALL [phone]     [facility] - Preparing for Surgery Before surgery, you can play an important role.  Because skin is not sterile, your skin needs to be as free of germs as possible.  You can reduce the number of germs on your skin by washing with CHG (chlorahexidine gluconate) soap before surgery.  CHG is an antiseptic cleaner which kills germs and bonds with the skin to continue killing germs even after washing. Please DO NOT use if you have an allergy to CHG or antibacterial soaps.  If your skin becomes reddened/irritated stop using the CHG and  inform your nurse when you arrive at Short Stay. Do not shave (including legs and underarms) for at least 48 hours prior to the first CHG shower.  You may shave your face/neck. Please follow these instructions carefully:  1.  Shower with CHG Soap the night before surgery and the  morning of Surgery.  2.  If you choose to wash your hair, wash your hair first as usual with your  normal  shampoo.  3.  After you shampoo, rinse your hair and body thoroughly to remove the  shampoo.                           4.  Use CHG as you would any other liquid soap.  You can apply chg directly  to the skin and wash                       Gently with a scrungie or clean washcloth.  5.  Apply the CHG Soap to your body ONLY FROM THE NECK DOWN.   Do not use on face/ open                           Wound or open sores. Avoid contact with eyes, ears mouth and genitals (private parts).                       Wash face,  Genitals (private parts) with your normal soap.  6.  Wash thoroughly, paying special attention to the area where your surgery  will be performed.  7.  Thoroughly rinse your body with warm water from the neck down.  8.  DO NOT shower/wash with your normal soap after using and rinsing off  the CHG Soap.                9.  Pat yourself dry with a clean towel.            10.  Wear clean pajamas.            11.  Place clean sheets on your bed the night of your first shower and do not  sleep with pets. Day of Surgery : Do not apply any lotions/deodorants the morning of surgery.  Please wear clean clothes to the hospital/surgery center.  FAILURE TO FOLLOW THESE INSTRUCTIONS MAY RESULT IN THE CANCELLATION OF YOUR SURGERY PATIENT SIGNATURE_________________________________  NURSE SIGNATURE__________________________________  ________________________________________________________________________

## 2021-12-24 ENCOUNTER — Encounter (HOSPITAL_COMMUNITY): Payer: Self-pay

## 2021-12-24 ENCOUNTER — Encounter (HOSPITAL_COMMUNITY)
Admission: RE | Admit: 2021-12-24 | Discharge: 2021-12-24 | Disposition: A | Discharge status: Home / Self Care | Payer: Medicare Other | Source: Ambulatory Visit | Attending: Orthopedic Surgery | Admitting: Orthopedic Surgery

## 2021-12-24 ENCOUNTER — Other Ambulatory Visit: Payer: Self-pay

## 2021-12-24 VITALS — BP 152/84 | HR 56 | Temp 97.8°F | Ht 63.0 in | Wt 148.0 lb

## 2021-12-24 DIAGNOSIS — Z01818 Encounter for other preprocedural examination: Secondary | ICD-10-CM | POA: Diagnosis not present

## 2021-12-24 DIAGNOSIS — E119 Type 2 diabetes mellitus without complications: Secondary | ICD-10-CM

## 2021-12-24 DIAGNOSIS — I10 Essential (primary) hypertension: Secondary | ICD-10-CM | POA: Diagnosis not present

## 2021-12-24 HISTORY — DX: Left bundle-branch block, unspecified: I44.7

## 2021-12-24 LAB — TYPE AND SCREEN
ABO/RH(D): O POS
Antibody Screen: NEGATIVE

## 2021-12-24 LAB — HEMOGLOBIN A1C
Hgb A1c MFr Bld: 5.9 % — ABNORMAL HIGH (ref 4.8–5.6)
Mean Plasma Glucose: 122.63 mg/dL

## 2021-12-24 LAB — CBC
HCT: 37.8 % (ref 36.0–46.0)
Hemoglobin: 12 g/dL (ref 12.0–15.0)
MCH: 29.1 pg (ref 26.0–34.0)
MCHC: 31.7 g/dL (ref 30.0–36.0)
MCV: 91.7 fL (ref 80.0–100.0)
Platelets: 267 10*3/uL (ref 150–400)
RBC: 4.12 MIL/uL (ref 3.87–5.11)
RDW: 15.8 % — ABNORMAL HIGH (ref 11.5–15.5)
WBC: 5.4 10*3/uL (ref 4.0–10.5)
nRBC: 0 % (ref 0.0–0.2)

## 2021-12-24 LAB — BASIC METABOLIC PANEL
Anion gap: 7 (ref 5–15)
BUN: 9 mg/dL (ref 8–23)
CO2: 27 mmol/L (ref 22–32)
Calcium: 8.9 mg/dL (ref 8.9–10.3)
Chloride: 107 mmol/L (ref 98–111)
Creatinine, Ser: 0.99 mg/dL (ref 0.44–1.00)
GFR, Estimated: >60 mL/min (ref >60)
Glucose, Bld: 99 mg/dL (ref 70–99)
Potassium: 3.7 mmol/L (ref 3.5–5.1)
Sodium: 141 mmol/L (ref 135–145)

## 2021-12-24 LAB — SURGICAL PCR SCREEN
MRSA, PCR: NEGATIVE
Staphylococcus aureus: NEGATIVE

## 2021-12-24 LAB — GLUCOSE, CAPILLARY: Glucose-Capillary: 104 mg/dL — ABNORMAL HIGH (ref 70–99)

## 2021-12-24 NOTE — Progress Notes (Signed)
For Short Stay: [place] appointment date: Date of COVID positive in last 37 days:  Bowel Prep reminder:   For Anesthesia: PCP - Windell Hummingbird: PA-C Cardiologist - Dr. Shirlee More. LOV: 08/19/20  Chest x-ray - 08/27/21 EKG -  Stress Test -  ECHO - 04/11/20 Cardiac Cath - 09/04/18 Pacemaker/ICD device last checked: Pacemaker orders received: Device Rep notified:  Spinal Cord Stimulator:  Sleep Study -  CPAP -   Fasting Blood Sugar - N/A Checks Blood Sugar _____ times a day Date and result of last Hgb A1c-  Blood Thinner Instructions: Aspirin Instructions: Last Dose:  Activity level: Can go up a flight of stairs and activities of daily living without stopping and without chest pain and/or shortness of breath   Able to exercise without chest pain and/or shortness of breath   Unable to go up a flight of stairs without chest pain and/or shortness of breath     Anesthesia review: Hx: HTN,Smoker,CHF,COPD,Stroke,Pre-DIA.  Patient denies shortness of breath, fever, cough and chest pain at PAT appointment   Patient verbalized understanding of instructions that were given to them at the PAT appointment. Patient was also instructed that they will need to review over the PAT instructions again at home before surgery.

## 2021-12-29 ENCOUNTER — Encounter (HOSPITAL_COMMUNITY): Payer: Self-pay

## 2022-01-04 NOTE — H&P (Signed)
TOTAL HIP ADMISSION H&P  Patient is admitted for left total hip arthroplasty.  Subjective:  Chief Complaint: left hip pain  HPI: Yvonne Campbell, 71 y.o. female, has a history of pain and functional disability in the left hip(s) due to arthritis and patient has failed non-surgical conservative treatments for greater than 12 weeks to include NSAID's and/or analgesics and activity modification.  Onset of symptoms was gradual starting 1 years ago with gradually worsening course since that time.The patient noted no past surgery on the left hip(s).  Patient currently rates pain in the left hip at 8 out of 10 with activity. Patient has worsening of pain with activity and weight bearing, pain that interfers with activities of daily living, and pain with passive range of motion. Patient has evidence of joint space narrowing by imaging studies. This condition presents safety issues increasing the risk of falls.  There is no current active infection.  Patient Active Problem List   Diagnosis Date Noted   S/P total right hip arthroplasty 08/25/2021   Menopause    LVH (left ventricular hypertrophy)    Impaired glucose tolerance    Hypertension    Hyperlipidemia    Drug use    Diabetes mellitus without complication (HCC)    Acute bronchitis    Epistaxis 11/22/2018   Pneumonia of right lower lobe due to infectious organism 10/23/2018   Left ventricular dysfunction 08/29/2018   Severe mitral regurgitation 08/29/2018   Tobacco abuse 08/29/2018   Hypertensive heart disease with heart failure ([place]) 08/29/2018   Primary osteoarthritis of right hip 05/01/2018   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   Past Medical History:  Diagnosis Date   Acute bronchitis    Anemia    Anxiety    CHF (congestive heart failure) (HCC)    chronic LBBB (left bundle branch block)    COPD (chronic obstructive pulmonary disease) ([place])    Drug use    Epistaxis 11/22/2018   GERD (gastroesophageal reflux  disease)    History of vitamin D deficiency    Hyperlipidemia    Hypertension    Hypertensive heart disease with heart failure ([place]) 08/29/2018   Essential hypertension   Impaired glucose tolerance    Left ventricular dysfunction 08/29/2018   Left ventricular dysfunction   Leukemia (HCC)    LVH (left ventricular hypertrophy)    Menopause    Paresthesia of hand    left   Pneumonia of right lower lobe due to infectious organism 10/23/2018   Pre-diabetes    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   Primary osteoarthritis of right hip 05/01/2018   Severe mitral regurgitation 08/29/2018   Mitral regurgitation   Stroke ([place])    Tobacco abuse 08/29/2018   Tobacco abuse    Past Surgical History:  Procedure Laterality Date   CARDIAC CATHETERIZATION     CESAREAN SECTION     CHOLECYSTECTOMY  2011   REVERSE SHOULDER ARTHROPLASTY Right 09/05/2020   Procedure: REVERSE SHOULDER ARTHROPLASTY;  Surgeon: Netta Cedars, MD;  Location: [facility];  Service: Orthopedics;  Laterality: Right;  with interscalene block   RIGHT/LEFT HEART CATH AND CORONARY ANGIOGRAPHY N/A 09/04/2018   Procedure: RIGHT/LEFT HEART CATH AND CORONARY ANGIOGRAPHY;  Surgeon: Lorretta Harp, MD;  Location: [place] [facility];  Service: Cardiovascular;  Laterality: N/A;   TOTAL HIP ARTHROPLASTY Right 08/25/2021   Procedure: TOTAL HIP ARTHROPLASTY ANTERIOR APPROACH;  Surgeon: Paralee Cancel, MD;  Location: [facility];  Service: Orthopedics;  Laterality: Right;    No  current facility-administered medications for this encounter.   Current Outpatient Medications  Medication Sig Dispense Refill Last Dose   acetaminophen (TYLENOL) 500 MG tablet Take 500 mg by mouth in the morning and at bedtime.      atorvastatin (LIPITOR) 10 MG tablet Take 10 mg by mouth at bedtime.      carvedilol (COREG) 25 MG tablet Take 25 mg by mouth 2 (two) times daily.      ENTRESTO 97-103 MG Take 1 tablet by mouth 2 (two) times a day.      fluticasone  furoate-vilanterol (BREO ELLIPTA) 100-25 MCG/ACT AEPB Inhale 1 puff into the lungs daily.      gabapentin (NEURONTIN) 300 MG capsule Take 600 mg by mouth 2 (two) times daily.      methocarbamol (ROBAXIN) 500 MG tablet Take 500 mg by mouth every 6 (six) hours as needed for muscle spasms.      naproxen (EC NAPROSYN) 500 MG EC tablet Take 500 mg by mouth 2 (two) times daily with a meal.      omeprazole (PRILOSEC) 40 MG capsule Take 40 mg by mouth daily.      PROAIR HFA 108 (90 Base) MCG/ACT inhaler Inhale 2 puffs into the lungs every 6 (six) hours as needed for wheezing or shortness of breath.      venlafaxine (EFFEXOR) 75 MG tablet Take 75 mg by mouth daily.      docusate sodium (COLACE) 100 MG capsule Take 1 capsule (100 mg total) by mouth 2 (two) times daily. (Patient not taking: Reported on 12/17/2021) 10 capsule 0 Not Taking   HYDROcodone-acetaminophen (NORCO) 7.5-325 MG tablet Take 1 tablet by mouth every 4 (four) hours as needed for severe pain (pain score 7-10). (Patient not taking: Reported on 12/17/2021) 42 tablet 0 Not Taking   polyethylene glycol (MIRALAX / GLYCOLAX) 17 g packet Take 17 g by mouth daily as needed for mild constipation. (Patient not taking: Reported on 12/17/2021) 14 each 0 Not Taking   tiZANidine (ZANAFLEX) 4 MG tablet Take 1 tablet (4 mg total) by mouth every 8 (eight) hours as needed for muscle spasms. (Patient not taking: Reported on 12/17/2021) 30 tablet 0 Not Taking   Allergies  Allergen Reactions   Antiseborrheic Products, Misc. Swelling   Latex Rash   Penicillins Swelling    Did it involve swelling of the face/tongue/throat, SOB, or low BP? Unknown Did it involve sudden or severe rash/hives, skin peeling, or any reaction on the inside of your mouth or nose? Unknown Did you need to seek medical attention at a hospital or doctor's office? Unknown When did it last happen?      unk If all above answers are "NO", may proceed with cephalosporin use. Tolerated  Cephalosporin 08/25/21     Sulfa Antibiotics Itching and Rash    Social History   Tobacco Use   Smoking status: Some Days    Packs/day: 0.50    Years: 35.00    Total pack years: 17.50    Types: Cigarettes    Last attempt to quit: 10/2018    Years since quitting: 3.1   Smokeless tobacco: Never   Tobacco comments:    10/06/20 3-4 daily  Substance Use Topics   Alcohol use: Not Currently    Comment: 10/06/20 none    Family History  Problem Relation Age of Onset   Hypertension Mother    Breast cancer Mother    Heart failure Mother    Heart attack Paternal Grandfather  Hypertension Paternal Grandfather    Heart failure Father      Review of Systems  Constitutional:  Negative for chills and fever.  Respiratory:  Negative for cough and shortness of breath.   Cardiovascular:  Negative for chest pain.  Gastrointestinal:  Negative for nausea and vomiting.  Musculoskeletal:  Positive for arthralgias.     Objective:  Physical Exam Well nourished and well developed. General: Alert and oriented x3, cooperative and pleasant, no acute distress. Head: normocephalic, atraumatic, neck supple. Eyes: EOMI.  Musculoskeletal: Left hip exam: Pain with active hip flexion as well as passive hip flexion internal rotation over 5 degrees with pelvic tilting, external rotation to 20 degrees  Calves soft and nontender. Motor function intact in LE. Strength 5/5 LE bilaterally. Neuro: Distal pulses 2+. Sensation to light touch intact in LE.  Vital signs in last 24 hours:    Labs:   Estimated body mass index is 26.22 kg/m as calculated from the following:   Height as of 12/24/21: '5\' 3"'$  (1.6 m).   Weight as of 12/24/21: 67.1 kg.   Imaging Review Plain radiographs demonstrate severe degenerative joint disease of the left hip(s). The bone quality appears to be adequate for age and reported activity level.      Assessment/Plan:  End stage arthritis, left hip(s)  The patient  history, physical examination, clinical judgement of the provider and imaging studies are consistent with end stage degenerative joint disease of the left hip(s) and total hip arthroplasty is deemed medically necessary. The treatment options including medical management, injection therapy, arthroscopy and arthroplasty were discussed at length. The risks and benefits of total hip arthroplasty were presented and reviewed. The risks due to aseptic loosening, infection, stiffness, dislocation/subluxation,  thromboembolic complications and other imponderables were discussed.  The patient acknowledged the explanation, agreed to proceed with the plan and consent was signed. Patient is being admitted for inpatient treatment for surgery, pain control, PT, OT, prophylactic antibiotics, VTE prophylaxis, progressive ambulation and ADL's and discharge planning.The patient is planning to be discharged  home.  Therapy Plans: HEP Disposition: Home with daughter Planned DVT Prophylaxis: aspirin '81mg'$  BID DME needed: none PCP: Dr. Claudie Leach, clearance received TXA: IV Allergies: PCN - hives, angioedema, sulfa drugs - hives & angioedema Anesthesia Concerns: none BMI: 26.6 Last HgbA1c:  Other: - Dilaudid (bad pain x3 days), tizanidine, tylenol - Does okay with cephalosporins - apparently undergoing CT scan due to ongoing appetite changes?   Costella Hatcher, PA-C Orthopedic Surgery [facility] [place] [phone]

## 2022-01-05 ENCOUNTER — Observation Stay (HOSPITAL_COMMUNITY)
Admission: RE | Admit: 2022-01-05 | Discharge: 2022-01-06 | Disposition: A | Discharge status: Home / Self Care | Payer: Medicare Other | Source: Ambulatory Visit | Attending: Orthopedic Surgery | Admitting: Orthopedic Surgery

## 2022-01-05 ENCOUNTER — Encounter (HOSPITAL_COMMUNITY)
Admission: RE | Disposition: A | Discharge status: Home / Self Care | Payer: Self-pay | Source: Ambulatory Visit | Attending: Orthopedic Surgery

## 2022-01-05 ENCOUNTER — Other Ambulatory Visit: Payer: Self-pay

## 2022-01-05 ENCOUNTER — Encounter (HOSPITAL_COMMUNITY): Payer: Self-pay | Admitting: Orthopedic Surgery

## 2022-01-05 ENCOUNTER — Ambulatory Visit (HOSPITAL_COMMUNITY): Payer: Medicare Other

## 2022-01-05 ENCOUNTER — Observation Stay (HOSPITAL_COMMUNITY): Payer: Medicare Other

## 2022-01-05 ENCOUNTER — Ambulatory Visit (HOSPITAL_COMMUNITY): Payer: Medicare Other | Admitting: Physician Assistant

## 2022-01-05 ENCOUNTER — Ambulatory Visit (HOSPITAL_BASED_OUTPATIENT_CLINIC_OR_DEPARTMENT_OTHER): Payer: Medicare Other | Admitting: Anesthesiology

## 2022-01-05 DIAGNOSIS — Z96642 Presence of left artificial hip joint: Secondary | ICD-10-CM

## 2022-01-05 DIAGNOSIS — Z96641 Presence of right artificial hip joint: Secondary | ICD-10-CM | POA: Insufficient documentation

## 2022-01-05 DIAGNOSIS — J449 Chronic obstructive pulmonary disease, unspecified: Secondary | ICD-10-CM | POA: Insufficient documentation

## 2022-01-05 DIAGNOSIS — I110 Hypertensive heart disease with heart failure: Secondary | ICD-10-CM | POA: Diagnosis not present

## 2022-01-05 DIAGNOSIS — I509 Heart failure, unspecified: Secondary | ICD-10-CM | POA: Insufficient documentation

## 2022-01-05 DIAGNOSIS — M1612 Unilateral primary osteoarthritis, left hip: Principal | ICD-10-CM | POA: Insufficient documentation

## 2022-01-05 DIAGNOSIS — Z79899 Other long term (current) drug therapy: Secondary | ICD-10-CM | POA: Insufficient documentation

## 2022-01-05 DIAGNOSIS — Z8673 Personal history of transient ischemic attack (TIA), and cerebral infarction without residual deficits: Secondary | ICD-10-CM | POA: Insufficient documentation

## 2022-01-05 DIAGNOSIS — E119 Type 2 diabetes mellitus without complications: Secondary | ICD-10-CM | POA: Diagnosis not present

## 2022-01-05 DIAGNOSIS — Z87891 Personal history of nicotine dependence: Secondary | ICD-10-CM | POA: Insufficient documentation

## 2022-01-05 DIAGNOSIS — F17210 Nicotine dependence, cigarettes, uncomplicated: Secondary | ICD-10-CM

## 2022-01-05 HISTORY — PX: TOTAL HIP ARTHROPLASTY: SHX124

## 2022-01-05 LAB — GLUCOSE, CAPILLARY
Glucose-Capillary: 106 mg/dL — ABNORMAL HIGH (ref 70–99)
Glucose-Capillary: 93 mg/dL (ref 70–99)

## 2022-01-05 SURGERY — ARTHROPLASTY, HIP, TOTAL, ANTERIOR APPROACH
Anesthesia: Spinal | Site: Hip | Laterality: Left

## 2022-01-05 MED ORDER — PANTOPRAZOLE SODIUM 40 MG PO TBEC
40.0000 mg | DELAYED_RELEASE_TABLET | Freq: Every day | ORAL | Status: DC
  Administered 2022-01-06: 40 mg via ORAL
  Filled 2022-01-05: qty 1

## 2022-01-05 MED ORDER — PROPOFOL 500 MG/50ML IV EMUL
INTRAVENOUS | Status: DC | PRN
  Administered 2022-01-05: 80 ug/kg/min via INTRAVENOUS

## 2022-01-05 MED ORDER — FLUTICASONE FUROATE-VILANTEROL 100-25 MCG/ACT IN AEPB
1.0000 | INHALATION_SPRAY | Freq: Every day | RESPIRATORY_TRACT | Status: DC
Start: 2022-01-06 — End: 2022-01-06
  Administered 2022-01-06: 1 via RESPIRATORY_TRACT
  Filled 2022-01-05: qty 28

## 2022-01-05 MED ORDER — ALBUTEROL SULFATE (2.5 MG/3ML) 0.083% IN NEBU: 3.0000 mL | INHALATION_SOLUTION | Freq: Four times a day (QID) | RESPIRATORY_TRACT | Status: DC | PRN

## 2022-01-05 MED ORDER — FENTANYL CITRATE (PF) 100 MCG/2ML IJ SOLN
INTRAMUSCULAR | Status: DC | PRN
Start: 2022-01-05 — End: 2022-01-05
  Administered 2022-01-05: 50 ug via INTRAVENOUS

## 2022-01-05 MED ORDER — SODIUM CHLORIDE 0.9 % IV SOLN
INTRAVENOUS | Status: DC
Start: 2022-01-05 — End: 2022-01-06

## 2022-01-05 MED ORDER — TRANEXAMIC ACID-NACL 1000-0.7 MG/100ML-% IV SOLN
1000.0000 mg | INTRAVENOUS | Status: AC
  Administered 2022-01-05: 1000 mg via INTRAVENOUS
  Filled 2022-01-05: qty 100

## 2022-01-05 MED ORDER — FENTANYL CITRATE (PF) 100 MCG/2ML IJ SOLN
INTRAMUSCULAR | Status: AC
  Filled 2022-01-05: qty 2

## 2022-01-05 MED ORDER — SODIUM CHLORIDE 0.9 % IR SOLN
Status: DC | PRN
  Administered 2022-01-05: 1000 mL

## 2022-01-05 MED ORDER — ONDANSETRON HCL 4 MG/2ML IJ SOLN
INTRAMUSCULAR | Status: AC
  Filled 2022-01-05: qty 2

## 2022-01-05 MED ORDER — VENLAFAXINE HCL 75 MG PO TABS
75.0000 mg | ORAL_TABLET | Freq: Every day | ORAL | Status: DC
  Administered 2022-01-06: 75 mg via ORAL
  Filled 2022-01-05: qty 1

## 2022-01-05 MED ORDER — MENTHOL 3 MG MT LOZG: 1.0000 | LOZENGE | OROMUCOSAL | Status: DC | PRN

## 2022-01-05 MED ORDER — ORAL CARE MOUTH RINSE: 15.0000 mL | Freq: Once | OROMUCOSAL | Status: AC

## 2022-01-05 MED ORDER — ONDANSETRON HCL 4 MG PO TABS: 4.0000 mg | ORAL_TABLET | Freq: Four times a day (QID) | ORAL | Status: DC | PRN

## 2022-01-05 MED ORDER — POLYETHYLENE GLYCOL 3350 17 G PO PACK: 17.0000 g | PACK | Freq: Every day | ORAL | Status: DC | PRN

## 2022-01-05 MED ORDER — CHLORHEXIDINE GLUCONATE 0.12 % MT SOLN
15.0000 mL | Freq: Once | OROMUCOSAL | Status: AC
  Administered 2022-01-05: 15 mL via OROMUCOSAL

## 2022-01-05 MED ORDER — GABAPENTIN 300 MG PO CAPS
600.0000 mg | ORAL_CAPSULE | Freq: Two times a day (BID) | ORAL | Status: DC
  Administered 2022-01-05 – 2022-01-06 (×2): 600 mg via ORAL
  Filled 2022-01-05 (×2): qty 2

## 2022-01-05 MED ORDER — ACETAMINOPHEN 500 MG PO TABS
1000.0000 mg | ORAL_TABLET | Freq: Four times a day (QID) | ORAL | Status: DC
  Administered 2022-01-05 – 2022-01-06 (×3): 1000 mg via ORAL
  Filled 2022-01-05 (×4): qty 2

## 2022-01-05 MED ORDER — TIZANIDINE HCL 4 MG PO TABS
4.0000 mg | ORAL_TABLET | Freq: Three times a day (TID) | ORAL | Status: DC | PRN
Start: 2022-01-05 — End: 2022-01-06
  Administered 2022-01-05: 4 mg via ORAL
  Filled 2022-01-05: qty 1

## 2022-01-05 MED ORDER — CARVEDILOL 25 MG PO TABS
25.0000 mg | ORAL_TABLET | Freq: Two times a day (BID) | ORAL | Status: DC
  Administered 2022-01-05 – 2022-01-06 (×2): 25 mg via ORAL
  Filled 2022-01-05 (×2): qty 1

## 2022-01-05 MED ORDER — POVIDONE-IODINE 10 % EX SWAB
2.0000 "application " | Freq: Once | CUTANEOUS | Status: AC
  Administered 2022-01-05: 2 via TOPICAL

## 2022-01-05 MED ORDER — TRANEXAMIC ACID-NACL 1000-0.7 MG/100ML-% IV SOLN
1000.0000 mg | Freq: Once | INTRAVENOUS | Status: AC
  Administered 2022-01-05: 1000 mg via INTRAVENOUS
  Filled 2022-01-05: qty 100

## 2022-01-05 MED ORDER — PHENOL 1.4 % MT LIQD: 1.0000 | OROMUCOSAL | Status: DC | PRN

## 2022-01-05 MED ORDER — ASPIRIN 81 MG PO CHEW
81.0000 mg | CHEWABLE_TABLET | Freq: Two times a day (BID) | ORAL | Status: DC
  Administered 2022-01-05 – 2022-01-06 (×2): 81 mg via ORAL
  Filled 2022-01-05 (×2): qty 1

## 2022-01-05 MED ORDER — DEXAMETHASONE SODIUM PHOSPHATE 10 MG/ML IJ SOLN
8.0000 mg | Freq: Once | INTRAMUSCULAR | Status: AC
  Administered 2022-01-05: 8 mg via INTRAVENOUS

## 2022-01-05 MED ORDER — CEFAZOLIN SODIUM-DEXTROSE 2-4 GM/100ML-% IV SOLN
2.0000 g | Freq: Four times a day (QID) | INTRAVENOUS | Status: AC
  Administered 2022-01-05 – 2022-01-06 (×2): 2 g via INTRAVENOUS
  Filled 2022-01-05 (×2): qty 100

## 2022-01-05 MED ORDER — DIPHENHYDRAMINE HCL 12.5 MG/5ML PO ELIX
12.5000 mg | ORAL_SOLUTION | ORAL | Status: DC | PRN
  Administered 2022-01-05: 25 mg via ORAL
  Filled 2022-01-05: qty 10

## 2022-01-05 MED ORDER — METOCLOPRAMIDE HCL 5 MG PO TABS: 5.0000 mg | ORAL_TABLET | Freq: Three times a day (TID) | ORAL | Status: DC | PRN

## 2022-01-05 MED ORDER — PROPOFOL 1000 MG/100ML IV EMUL
INTRAVENOUS | Status: AC
  Filled 2022-01-05: qty 100

## 2022-01-05 MED ORDER — PHENYLEPHRINE HCL-NACL 20-0.9 MG/250ML-% IV SOLN
INTRAVENOUS | Status: DC | PRN
  Administered 2022-01-05: 50 ug/min via INTRAVENOUS

## 2022-01-05 MED ORDER — HYDROMORPHONE HCL 2 MG PO TABS: 2.0000 mg | ORAL_TABLET | ORAL | Status: DC | PRN

## 2022-01-05 MED ORDER — METOCLOPRAMIDE HCL 5 MG/ML IJ SOLN: 5.0000 mg | Freq: Three times a day (TID) | INTRAMUSCULAR | Status: DC | PRN

## 2022-01-05 MED ORDER — DEXAMETHASONE SODIUM PHOSPHATE 10 MG/ML IJ SOLN
INTRAMUSCULAR | Status: AC
  Filled 2022-01-05: qty 1

## 2022-01-05 MED ORDER — DOCUSATE SODIUM 100 MG PO CAPS
100.0000 mg | ORAL_CAPSULE | Freq: Two times a day (BID) | ORAL | Status: DC
  Administered 2022-01-05 – 2022-01-06 (×2): 100 mg via ORAL
  Filled 2022-01-05 (×2): qty 1

## 2022-01-05 MED ORDER — BISACODYL 10 MG RE SUPP: 10.0000 mg | Freq: Every day | RECTAL | Status: DC | PRN

## 2022-01-05 MED ORDER — BUPIVACAINE IN DEXTROSE 0.75-8.25 % IT SOLN
INTRATHECAL | Status: DC | PRN
  Administered 2022-01-05: 1.6 mL via INTRATHECAL

## 2022-01-05 MED ORDER — ACETAMINOPHEN 325 MG PO TABS
325.0000 mg | ORAL_TABLET | Freq: Four times a day (QID) | ORAL | Status: DC | PRN
  Administered 2022-01-06: 500 mg via ORAL

## 2022-01-05 MED ORDER — FERROUS SULFATE 325 (65 FE) MG PO TABS
325.0000 mg | ORAL_TABLET | Freq: Three times a day (TID) | ORAL | Status: DC
  Administered 2022-01-06 (×2): 325 mg via ORAL
  Filled 2022-01-05 (×2): qty 1

## 2022-01-05 MED ORDER — ACETAMINOPHEN 500 MG PO TABS
1000.0000 mg | ORAL_TABLET | Freq: Once | ORAL | Status: AC
  Administered 2022-01-05: 1000 mg via ORAL
  Filled 2022-01-05: qty 2

## 2022-01-05 MED ORDER — ATORVASTATIN CALCIUM 10 MG PO TABS
10.0000 mg | ORAL_TABLET | Freq: Every day | ORAL | Status: DC
  Administered 2022-01-05: 10 mg via ORAL
  Filled 2022-01-05: qty 1

## 2022-01-05 MED ORDER — LACTATED RINGERS IV SOLN: INTRAVENOUS | Status: DC

## 2022-01-05 MED ORDER — HYDROMORPHONE HCL 1 MG/ML IJ SOLN
0.5000 mg | INTRAMUSCULAR | Status: DC | PRN
  Administered 2022-01-05: 0.5 mg via INTRAVENOUS
  Filled 2022-01-05: qty 1

## 2022-01-05 MED ORDER — PROPOFOL 10 MG/ML IV BOLUS
INTRAVENOUS | Status: DC | PRN
  Administered 2022-01-05: 20 mg via INTRAVENOUS
  Administered 2022-01-05: 40 mg via INTRAVENOUS

## 2022-01-05 MED ORDER — GLYCOPYRROLATE PF 0.2 MG/ML IJ SOSY
PREFILLED_SYRINGE | INTRAMUSCULAR | Status: DC | PRN
  Administered 2022-01-05: .2 mg via INTRAVENOUS

## 2022-01-05 MED ORDER — SACUBITRIL-VALSARTAN 97-103 MG PO TABS
1.0000 | ORAL_TABLET | Freq: Two times a day (BID) | ORAL | Status: DC
  Administered 2022-01-05 – 2022-01-06 (×2): 1 via ORAL
  Filled 2022-01-05 (×2): qty 1

## 2022-01-05 MED ORDER — CEFAZOLIN SODIUM-DEXTROSE 2-4 GM/100ML-% IV SOLN
2.0000 g | INTRAVENOUS | Status: AC
  Administered 2022-01-05: 2 g via INTRAVENOUS
  Filled 2022-01-05: qty 100

## 2022-01-05 MED ORDER — DEXAMETHASONE SODIUM PHOSPHATE 10 MG/ML IJ SOLN
10.0000 mg | Freq: Once | INTRAMUSCULAR | Status: AC
  Administered 2022-01-06: 10 mg via INTRAVENOUS
  Filled 2022-01-05: qty 1

## 2022-01-05 MED ORDER — ONDANSETRON HCL 4 MG/2ML IJ SOLN
4.0000 mg | Freq: Four times a day (QID) | INTRAMUSCULAR | Status: DC | PRN
  Filled 2022-01-05: qty 2

## 2022-01-05 MED ORDER — HYDROMORPHONE HCL 2 MG PO TABS
1.0000 mg | ORAL_TABLET | ORAL | Status: DC | PRN
  Administered 2022-01-05: 1 mg via ORAL
  Administered 2022-01-05: 2 mg via ORAL
  Filled 2022-01-05 (×2): qty 1

## 2022-01-05 MED ORDER — ONDANSETRON HCL 4 MG/2ML IJ SOLN
INTRAMUSCULAR | Status: DC | PRN
  Administered 2022-01-05: 4 mg via INTRAVENOUS

## 2022-01-05 SURGICAL SUPPLY — 44 items
BAG COUNTER SPONGE SURGICOUNT (BAG) IMPLANT
BAG DECANTER FOR FLEXI CONT (MISCELLANEOUS) IMPLANT
BAG ZIPLOCK 12X15 (MISCELLANEOUS) IMPLANT
BLADE SAG 18X100X1.27 (BLADE) ×2 IMPLANT
COVER PERINEAL POST (MISCELLANEOUS) ×2 IMPLANT
COVER SURGICAL LIGHT HANDLE (MISCELLANEOUS) ×2 IMPLANT
CUP ACETBLR 54 OD PINNACLE (Hips) ×1 IMPLANT
DERMABOND ADVANCED (GAUZE/BANDAGES/DRESSINGS) ×1
DERMABOND ADVANCED .7 DNX12 (GAUZE/BANDAGES/DRESSINGS) ×1 IMPLANT
DRAPE FOOT SWITCH (DRAPES) ×2 IMPLANT
DRAPE STERI IOBAN 125X83 (DRAPES) ×2 IMPLANT
DRAPE U-SHAPE 47X51 STRL (DRAPES) ×4 IMPLANT
DRESSING AQUACEL AG SP 3.5X10 (GAUZE/BANDAGES/DRESSINGS) ×1 IMPLANT
DRSG AQUACEL AG ADV 3.5X10 (GAUZE/BANDAGES/DRESSINGS) ×1 IMPLANT
DRSG AQUACEL AG SP 3.5X10 (GAUZE/BANDAGES/DRESSINGS) ×2
DURAPREP 26ML APPLICATOR (WOUND CARE) ×2 IMPLANT
ELECT REM PT RETURN 15FT ADLT (MISCELLANEOUS) ×2 IMPLANT
ELIMINATOR HOLE APEX DEPUY (Hips) ×1 IMPLANT
GLOVE BIO SURGEON STRL SZ 6 (GLOVE) ×2 IMPLANT
GLOVE BIOGEL PI IND STRL 6.5 (GLOVE) ×1 IMPLANT
GLOVE BIOGEL PI IND STRL 7.5 (GLOVE) ×1 IMPLANT
GLOVE BIOGEL PI INDICATOR 6.5 (GLOVE) ×1
GLOVE BIOGEL PI INDICATOR 7.5 (GLOVE) ×1
GLOVE ORTHO TXT STRL SZ7.5 (GLOVE) ×4 IMPLANT
GOWN STRL REUS W/ TWL LRG LVL3 (GOWN DISPOSABLE) ×3 IMPLANT
GOWN STRL REUS W/TWL LRG LVL3 (GOWN DISPOSABLE) ×3
HEAD CERAMIC 36 PLUS5 (Hips) ×1 IMPLANT
HOLDER FOLEY CATH W/STRAP (MISCELLANEOUS) ×2 IMPLANT
KIT TURNOVER KIT A (KITS) IMPLANT
LINER NEUTRAL 54X36MM PLUS 4 (Hips) ×1 IMPLANT
PACK ANTERIOR HIP CUSTOM (KITS) ×2 IMPLANT
SCREW 6.5MMX30MM (Screw) ×1 IMPLANT
STEM FEM ACTIS HIGH SZ7 (Stem) ×1 IMPLANT
SUT MNCRL AB 4-0 PS2 18 (SUTURE) ×2 IMPLANT
SUT STRATAFIX 0 PDS 27 VIOLET (SUTURE) ×2
SUT VIC AB 1 CT1 36 (SUTURE) ×6 IMPLANT
SUT VIC AB 2-0 CT1 27 (SUTURE) ×2
SUT VIC AB 2-0 CT1 TAPERPNT 27 (SUTURE) ×2 IMPLANT
SUTURE STRATFX 0 PDS 27 VIOLET (SUTURE) ×1 IMPLANT
TRAY FOL W/BAG SLVR 16FR STRL (SET/KITS/TRAYS/PACK) IMPLANT
TRAY FOLEY MTR SLVR 16FR STAT (SET/KITS/TRAYS/PACK) IMPLANT
TRAY FOLEY W/BAG SLVR 16FR LF (SET/KITS/TRAYS/PACK) ×1
TUBE SUCTION HIGH CAP CLEAR NV (SUCTIONS) ×2 IMPLANT
WATER STERILE IRR 1000ML POUR (IV SOLUTION) ×2 IMPLANT

## 2022-01-05 NOTE — Transfer of Care (Signed)
Immediate Anesthesia Transfer of Care Note  Patient: Yvonne Campbell  Procedure(s) Performed: TOTAL HIP ARTHROPLASTY ANTERIOR APPROACH (Left: Hip)  Patient Location: PACU  Anesthesia Type:Spinal and MAC combined with regional for post-op pain  Level of Consciousness: awake, alert , oriented and patient cooperative  Airway & Oxygen Therapy: Patient Spontanous Breathing and Patient connected to face mask oxygen  Post-op Assessment: Report given to RN and Post -op Vital signs reviewed and stable  Post vital signs: Reviewed and stable  Last Vitals:  Vitals Value Taken Time  BP 102/67 01/05/22 1252  Temp    Pulse 58 01/05/22 1253  Resp 15 01/05/22 1253  SpO2 93 % 01/05/22 1253  Vitals shown include unvalidated device data.  Last Pain:  Vitals:   01/05/22 0917  TempSrc:   PainSc: 5       Patients Stated Pain Goal: 4 (28/63/81 7711)  Complications: No notable events documented.

## 2022-01-05 NOTE — Anesthesia Procedure Notes (Addendum)
Spinal  Patient location during procedure: OR Start time: 01/05/2022 11:25 AM End time: 01/05/2022 11:28 AM Reason for block: surgical anesthesia Staffing Performed: anesthesiologist  Anesthesiologist: Santa Lighter, MD Performed by: Santa Lighter, MD Authorized by: Santa Lighter, MD   Preanesthetic Checklist Completed: patient identified, IV checked, risks and benefits discussed, surgical consent, monitors and equipment checked, pre-op evaluation and timeout performed Spinal Block Patient position: sitting Prep: DuraPrep and site prepped and draped Patient monitoring: continuous pulse ox and blood pressure Approach: midline Location: L3-4 Injection technique: single-shot Needle Needle type: Pencan  Needle gauge: 24 G Assessment Events: CSF return and second provider Additional Notes Functioning IV was confirmed and monitors were applied. Sterile prep and drape, including hand hygiene, mask and sterile gloves were used. The patient was positioned and the spine was prepped. The skin was anesthetized with lidocaine.  Free flow of clear CSF was obtained prior to injecting local anesthetic into the CSF.  The spinal needle aspirated freely following injection.  The needle was carefully withdrawn.  The patient tolerated the procedure well. Consent was obtained prior to procedure with all questions answered and concerns addressed. Risks including but not limited to bleeding, infection, nerve damage, paralysis, failed block, inadequate analgesia, allergic reaction, high spinal, itching and headache were discussed and the patient wished to proceed.   Hoy Morn, MD

## 2022-01-05 NOTE — Interval H&P Note (Signed)
History and Physical Interval Note:  01/05/2022 9:54 AM  Yvonne Campbell  has presented today for surgery, with the diagnosis of Left hip osteoarthritis.  The various methods of treatment have been discussed with the patient and family. After consideration of risks, benefits and other options for treatment, the patient has consented to  Procedure(s): TOTAL HIP ARTHROPLASTY ANTERIOR APPROACH (Left) as a surgical intervention.  The patient's history has been reviewed, patient examined, no change in status, stable for surgery.  I have reviewed the patient's chart and labs.  Questions were answered to the patient's satisfaction.     Mauri Pole

## 2022-01-05 NOTE — Anesthesia Postprocedure Evaluation (Signed)
Anesthesia Post Note  Patient: Yvonne Campbell  Procedure(s) Performed: TOTAL HIP ARTHROPLASTY ANTERIOR APPROACH (Left: Hip)     Patient location during evaluation: PACU Anesthesia Type: Spinal Level of consciousness: awake, awake and alert and oriented Pain management: pain level controlled Vital Signs Assessment: post-procedure vital signs reviewed and stable Respiratory status: spontaneous breathing, nonlabored ventilation and respiratory function stable Cardiovascular status: blood pressure returned to baseline and stable Postop Assessment: no headache, no backache, spinal receding and no apparent nausea or vomiting Anesthetic complications: no   No notable events documented.  Last Vitals:  Vitals:   01/05/22 1440 01/05/22 1450  BP: 127/65   Pulse: (!) 53 67  Resp: 16   Temp: 36.4 C   SpO2: 92% 95%    Last Pain:  Vitals:   01/05/22 1642  TempSrc:   PainSc: Brush

## 2022-01-05 NOTE — Plan of Care (Signed)

## 2022-01-05 NOTE — Discharge Instructions (Signed)

## 2022-01-05 NOTE — Evaluation (Signed)
Physical Therapy Evaluation Patient Details Name: Yvonne Campbell MRN: [id number] DOB: [birth year] Today's Date: 01/05/2022  History of Present Illness  71 y.o. female admitted 01/05/22 for L AA-THA. PMH: R AA-THA 08/25/21, HTN, drug and tobacco use, OA, R reverse TSA.  Clinical Impression  Pt is s/p THA resulting in the deficits listed below (see PT Problem List). Pt ambulated 100' with RW, no loss of balance.  Initiated THA HEP. Good progress expected.  Pt will benefit from skilled PT to increase their independence and safety with mobility to allow discharge to the venue listed below.         Recommendations for follow up therapy are one component of a multi-disciplinary discharge planning process, led by the attending physician.  Recommendations may be updated based on patient status, additional functional criteria and insurance authorization.  Follow Up Recommendations Follow physician's recommendations for discharge plan and follow up therapies    Assistance Recommended at Discharge Set up Supervision/Assistance  Patient can return home with the following  A little help with bathing/dressing/bathroom;Assistance with cooking/housework;Assist for transportation    Equipment Recommendations None recommended by PT  Recommendations for Other Services       Functional Status Assessment Patient has had a recent decline in their functional status and demonstrates the ability to make significant improvements in function in a reasonable and predictable amount of time.     Precautions / Restrictions Precautions Precautions: Fall Precaution Comments: pt denies falls in past 6 months Restrictions Weight Bearing Restrictions: No LLE Weight Bearing: Weight bearing as tolerated      Mobility  Bed Mobility Overal bed mobility: Modified Independent             General bed mobility comments: HOB up, used rail    Transfers Overall transfer level: Needs assistance Equipment used:  Rolling walker (2 wheels) Transfers: Sit to/from Stand Sit to Stand: Supervision           General transfer comment: good safety awareness    Ambulation/Gait Ambulation/Gait assistance: Supervision Gait Distance (Feet): 100 Feet Assistive device: Rolling walker (2 wheels) Gait Pattern/deviations: Step-to pattern, Decreased stride length Gait velocity: decr     General Gait Details: steady with RW, no loss of balance  Stairs            Wheelchair Mobility    Modified Rankin (Stroke Patients Only)       Balance Overall balance assessment: Modified Independent                                           Pertinent Vitals/Pain Pain Assessment Pain Assessment: No/denies pain    Home Living Family/patient expects to be discharged to:: Private residence Living Arrangements: Children Available Help at Discharge: Family;Available 24 hours/day Type of Home: House Home Access: Stairs to enter Entrance Stairs-Rails: Psychiatric nurse of Steps: 4 Alternate Level Stairs-Number of Steps: flight Home Layout: Two level;Bed/bath upstairs Home Equipment: Crutches;Cane - single point      Prior Function Prior Level of Function : Independent/Modified Independent             Mobility Comments: walked with RW prior to this surgery ADLs Comments: reports I with ADLs and IADLs     Hand Dominance        Extremity/Trunk Assessment   Upper Extremity Assessment Upper Extremity Assessment: Overall WFL for tasks assessed    Lower Extremity  Assessment Lower Extremity Assessment: LLE deficits/detail LLE Deficits / Details: hip flexion AAROM ~35* LLE Sensation: WNL LLE Coordination: WNL    Cervical / Trunk Assessment Cervical / Trunk Assessment: Normal  Communication   Communication: No difficulties  Cognition Arousal/Alertness: Awake/alert Behavior During Therapy: WFL for tasks assessed/performed Overall Cognitive Status: Within  Functional Limits for tasks assessed                                          General Comments      Exercises Total Joint Exercises Ankle Circles/Pumps: AROM, Both, 10 reps, Supine Heel Slides: AAROM, Left, 10 reps, Supine Hip ABduction/ADduction: AAROM, Left, 5 reps, Supine   Assessment/Plan    PT Assessment Patient needs continued PT services  PT Problem List Decreased range of motion;Decreased mobility;Decreased activity tolerance       PT Treatment Interventions Gait training;Therapeutic exercise;Therapeutic activities;Stair training;Functional mobility training;Patient/family education    PT Goals (Current goals can be found in the Care Plan section)  Acute Rehab PT Goals Patient Stated Goal: to go on a cruise PT Goal Formulation: With patient Time For Goal Achievement: 01/12/22 Potential to Achieve Goals: Good    Frequency 7X/week     Co-evaluation               AM-PAC PT "6 Clicks" Mobility  Outcome Measure Help needed turning from your back to your side while in a flat bed without using bedrails?: A Little Help needed moving from lying on your back to sitting on the side of a flat bed without using bedrails?: A Little Help needed moving to and from a bed to a chair (including a wheelchair)?: A Little Help needed standing up from a chair using your arms (e.g., wheelchair or bedside chair)?: A Little Help needed to walk in hospital room?: A Little Help needed climbing 3-5 steps with a railing? : A Lot 6 Click Score: 17    End of Session Equipment Utilized During Treatment: Gait belt Activity Tolerance: Patient tolerated treatment well Patient left: in chair;with chair alarm set;with call bell/phone within reach Nurse Communication: Mobility status PT Visit Diagnosis: Difficulty in walking, not elsewhere classified (R26.2)    Time: 4920-1007 PT Time Calculation (min) (ACUTE ONLY): 19 min   Charges:   PT Evaluation $PT Eval Moderate  Complexity: 1 Mod         Philomena Doheny PT 01/05/2022  Acute Rehabilitation Services Pager [phone] Office [phone]

## 2022-01-05 NOTE — Anesthesia Preprocedure Evaluation (Addendum)
Anesthesia Evaluation  Patient identified by MRN, date of birth, ID band Patient awake    Reviewed: Allergy & Precautions, NPO status , Patient's Chart, lab work & pertinent test results  Airway Mallampati: II  TM Distance: >3 FB Neck ROM: Full    Dental  (+) Edentulous Upper, Edentulous Lower   Pulmonary COPD,  COPD inhaler, Current Smoker and Patient abstained from smoking.,    Pulmonary exam normal breath sounds clear to auscultation       Cardiovascular hypertension, Pt. on medications (-) angina+CHF  Normal cardiovascular exam+ Valvular Problems/Murmurs MR  Rhythm:Regular Rate:Normal  Echo 10/25/2020 SUMMARY  NPS.  The left ventricular size is normal.  There is mild concentric left ventricular hypertrophy.  LV ejection fraction = 35-40%.  There is moderate global hypokinesis of the left ventricle.  There is basal LV inferior wall akinesis  The left atrium is mildly dilated.  There is mild to moderate mitral regurgitation.  There is basal LVseptal wall hypokinesis    Neuro/Psych PSYCHIATRIC DISORDERS Anxiety negative neurological ROS     GI/Hepatic GERD  Medicated,(+)     substance abuse  marijuana use,   Endo/Other  diabetes  Renal/GU negative Renal ROS     Musculoskeletal  (+) Arthritis , Osteoarthritis,    Abdominal   Peds  Hematology negative hematology ROS (+) Plt 267k   Anesthesia Other Findings Day of surgery medications reviewed with the patient.  Reproductive/Obstetrics                            Anesthesia Physical Anesthesia Plan  ASA: 3  Anesthesia Plan: Spinal   Post-op Pain Management: Tylenol PO (pre-op)*   Induction: Intravenous  PONV Risk Score and Plan: 1 and TIVA, Dexamethasone and Ondansetron  Airway Management Planned: Natural Airway and Nasal Cannula  Additional Equipment:   Intra-op Plan:   Post-operative Plan:   Informed Consent: I have  reviewed the patients History and Physical, chart, labs and discussed the procedure including the risks, benefits and alternatives for the proposed anesthesia with the patient or authorized representative who has indicated his/her understanding and acceptance.     Dental advisory given  Plan Discussed with: CRNA, Anesthesiologist and Surgeon  Anesthesia Plan Comments: (Discussed risks and benefits of and differences between spinal and general. Discussed risks of spinal including headache, backache, failure, bleeding, infection, and nerve damage. Patient consents to spinal. Questions answered. Coagulation studies and platelet count acceptable.)        Anesthesia Quick Evaluation

## 2022-01-05 NOTE — Op Note (Signed)
NAME:  Yvonne Campbell                ACCOUNT NO.: [id number]      MEDICAL RECORD NO.: [id number]      FACILITY:  [facility]      PHYSICIAN:  Mauri Pole  DATE OF BIRTH:  13-Jul-1951     DATE OF PROCEDURE:  01/05/2022                                 OPERATIVE REPORT         PREOPERATIVE DIAGNOSIS: Left  hip osteoarthritis.      POSTOPERATIVE DIAGNOSIS:  Left hip osteoarthritis.      PROCEDURE:  Left total hip replacement through an anterior approach   utilizing DePuy THR system, component size 52 mm pinnacle cup, a size 36+4 neutral   Altrex liner, a size 7 Hi Actis stem with a 36+5 delta ceramic   ball.      SURGEON:  Pietro Cassis. Alvan Dame, M.D.      ASSISTANT:  Costella Hatcher, PA-C     ANESTHESIA:  Spinal.      SPECIMENS:  None.      COMPLICATIONS:  None.      BLOOD LOSS:  200 cc     DRAINS:  None.      INDICATION OF THE PROCEDURE:  Yvonne Campbell is a 71 y.o. female who had   presented to office for evaluation of left hip pain.  Radiographs revealed   progressive degenerative changes with bone-on-bone   articulation of the  hip joint, including subchondral cystic changes and osteophytes.  The patient had painful limited range of   motion significantly affecting their overall quality of life and function.  The patient was failing to    respond to conservative measures including medications and/or injections and activity modification and at this point was ready   to proceed with more definitive measures.  Consent was obtained for   benefit of pain relief.  Specific risks of infection, DVT, component   failure, dislocation, neurovascular injury, and need for revision surgery were reviewed in the office.     PROCEDURE IN DETAIL:  The patient was brought to operative theater.   Once adequate anesthesia, preoperative antibiotics, 2 gm of Ancef, 1 gm of Tranexamic Acid, and 10 mg of Decadron were administered, the patient was positioned supine on the [employer]  table.  Once the patient was safely positioned with adequate padding of boney prominences we predraped out the hip, and used fluoroscopy to confirm orientation of the pelvis.      The left hip was then prepped and draped from proximal iliac crest to   mid thigh with a shower curtain technique.      Time-out was performed identifying the patient, planned procedure, and the appropriate extremity.     An incision was then made 2 cm lateral to the   anterior superior iliac spine extending over the orientation of the   tensor fascia lata muscle and sharp dissection was carried down to the   fascia of the muscle.      The fascia was then incised.  The muscle belly was identified and swept   laterally and retractor placed along the superior neck.  Following   cauterization of the circumflex vessels and removing some pericapsular   fat, a second cobra retractor was placed on the inferior  neck.  A T-capsulotomy was made along the line of the   superior neck to the trochanteric fossa, then extended proximally and   distally.  Tag sutures were placed and the retractors were then placed   intracapsular.  We then identified the trochanteric fossa and   orientation of my neck cut and then made a neck osteotomy with the femur on traction.  The femoral   head was removed without difficulty or complication.  Traction was let   off and retractors were placed posterior and anterior around the   acetabulum.      The labrum and foveal tissue were debrided.  I began reaming with a 49 mm   reamer and reamed up to 53 mm reamer with good bony bed preparation and a 54 mm  cup was chosen.  The final 54 mm Pinnacle cup was then impacted under fluoroscopy to confirm the depth of penetration and orientation with respect to   Abduction and forward flexion.  A screw was placed into the ilium followed by the hole eliminator.  The final   36+4 neutral Altrex liner was impacted with good visualized rim fit.  The cup was  positioned anatomically within the acetabular portion of the pelvis.      At this point, the femur was rolled to 100 degrees.  Further capsule was   released off the inferior aspect of the femoral neck.  I then   released the superior capsule proximally.  With the leg in a neutral position the hook was placed laterally   along the femur under the vastus lateralis origin and elevated manually and then held in position using the hook attachment on the bed.  The leg was then extended and adducted with the leg rolled to 100   degrees of external rotation.  Retractors were placed along the medial calcar and posteriorly over the greater trochanter.  Once the proximal femur was fully   exposed, I used a box osteotome to set orientation.  I then began   broaching with the starting chili pepper broach and passed this by hand and then broached up to 7.  With the 7 broach in place I chose a high offset neck and did several trial reductions.  The offset was appropriate, leg lengths   appeared to be equal best matched with the +5 head ball trial confirmed radiographically.   Given these findings, I went ahead and dislocated the hip, repositioned all   retractors and positioned the right hip in the extended and abducted position.  The final 7 Hi  Actis stem was   chosen and it was impacted down to the level of neck cut.  Based on this   and the trial reductions, a final 36+5 delta ceramic ball was chosen and   impacted onto a clean and dry trunnion, and the hip was reduced.  The   hip had been irrigated throughout the case again at this point.  I did   reapproximate the superior capsular leaflet to the anterior leaflet   using #1 Vicryl.  The fascia of the   tensor fascia lata muscle was then reapproximated using #1 Vicryl and #0 Stratafix sutures.  The   remaining wound was closed with 2-0 Vicryl and running 4-0 Monocryl.   The hip was cleaned, dried, and dressed sterilely using Dermabond and   Aquacel  dressing.  The patient was then brought   to recovery room in stable condition tolerating the procedure well.  Costella Hatcher, PA-C was present for the entirety of the case involved from   preoperative positioning, perioperative retractor management, general   facilitation of the case, as well as primary wound closure as assistant.            Pietro Cassis Alvan Dame, M.D.        01/05/2022 11:34 AM

## 2022-01-06 ENCOUNTER — Encounter (HOSPITAL_COMMUNITY): Payer: Self-pay | Admitting: Orthopedic Surgery

## 2022-01-06 DIAGNOSIS — M1612 Unilateral primary osteoarthritis, left hip: Secondary | ICD-10-CM | POA: Diagnosis not present

## 2022-01-06 LAB — BASIC METABOLIC PANEL
Anion gap: 8 (ref 5–15)
BUN: 9 mg/dL (ref 8–23)
CO2: 25 mmol/L (ref 22–32)
Calcium: 8.4 mg/dL — ABNORMAL LOW (ref 8.9–10.3)
Chloride: 102 mmol/L (ref 98–111)
Creatinine, Ser: 1.01 mg/dL — ABNORMAL HIGH (ref 0.44–1.00)
GFR, Estimated: 60 mL/min — ABNORMAL LOW (ref >60)
Glucose, Bld: 162 mg/dL — ABNORMAL HIGH (ref 70–99)
Potassium: 3.7 mmol/L (ref 3.5–5.1)
Sodium: 135 mmol/L (ref 135–145)

## 2022-01-06 LAB — CBC
HCT: 32.8 % — ABNORMAL LOW (ref 36.0–46.0)
Hemoglobin: 10.3 g/dL — ABNORMAL LOW (ref 12.0–15.0)
MCH: 29.3 pg (ref 26.0–34.0)
MCHC: 31.4 g/dL (ref 30.0–36.0)
MCV: 93.4 fL (ref 80.0–100.0)
Platelets: 233 10*3/uL (ref 150–400)
RBC: 3.51 MIL/uL — ABNORMAL LOW (ref 3.87–5.11)
RDW: 16.1 % — ABNORMAL HIGH (ref 11.5–15.5)
WBC: 10.8 10*3/uL — ABNORMAL HIGH (ref 4.0–10.5)
nRBC: 0 % (ref 0.0–0.2)

## 2022-01-06 MED ORDER — OXYCODONE HCL 5 MG PO TABS
5.0000 mg | ORAL_TABLET | ORAL | Status: DC | PRN
  Administered 2022-01-06 (×3): 5 mg via ORAL
  Filled 2022-01-06 (×3): qty 1

## 2022-01-06 MED ORDER — TIZANIDINE HCL 4 MG PO TABS: 4.0000 mg | ORAL_TABLET | Freq: Three times a day (TID) | ORAL | 0 refills | Status: AC | PRN

## 2022-01-06 MED ORDER — OXYCODONE HCL 5 MG PO TABS: 5.0000 mg | ORAL_TABLET | ORAL | 0 refills | Status: AC | PRN

## 2022-01-06 MED ORDER — ACETAMINOPHEN 500 MG PO TABS: 1000.0000 mg | ORAL_TABLET | Freq: Four times a day (QID) | ORAL | 0 refills | Status: AC | PRN

## 2022-01-06 MED ORDER — ASPIRIN 81 MG PO CHEW: 81.0000 mg | CHEWABLE_TABLET | Freq: Two times a day (BID) | ORAL | 0 refills | Status: AC

## 2022-01-06 MED ORDER — OXYCODONE HCL 5 MG PO TABS
5.0000 mg | ORAL_TABLET | ORAL | Status: DC | PRN
Start: 2022-01-06 — End: 2022-01-06

## 2022-01-06 MED ORDER — OXYCODONE HCL 5 MG PO TABS: 5.0000 mg | ORAL_TABLET | ORAL | Status: DC | PRN

## 2022-01-06 NOTE — Progress Notes (Signed)
Subjective: 1 Day Post-Op Procedure(s) (LRB): TOTAL HIP ARTHROPLASTY ANTERIOR APPROACH (Left) Patient reports pain as moderate.   Patient seen in rounds by Dr. Alvan Dame. Patient is resting in bed this morning on exam. No acute events overnight. She had increased pain overnight, and her pain meds were switched. She says she is doing better with this. Ambulated 100 feet with PT yesterday.  We will continue  therapy today.   Objective: Vital signs in last 24 hours: Temp:  [97.5 F (36.4 C)-98.5 F (36.9 C)] 98.5 F (36.9 C) (06/14 0559) Pulse Rate:  [47-83] 65 (06/14 0559) Resp:  [12-20] 18 (06/14 0559) BP: (88-157)/(58-85) 130/71 (06/14 0559) SpO2:  [90 %-100 %] 97 % (06/14 0559) Weight:  [67.1 kg] 67.1 kg (06/13 0917)  Intake/Output from previous day:  Intake/Output Summary (Last 24 hours) at 01/06/2022 0811 Last data filed at 01/06/2022 0559 Gross per 24 hour  Intake 1198.96 ml  Output 1050 ml  Net 148.96 ml     Intake/Output this shift: No intake/output data recorded.  Labs: Recent Labs    01/06/22 0340  HGB 10.3*   Recent Labs    01/06/22 0340  WBC 10.8*  RBC 3.51*  HCT 32.8*  PLT 233   Recent Labs    01/06/22 0340  NA 135  K 3.7  CL 102  CO2 25  BUN 9  CREATININE 1.01*  GLUCOSE 162*  CALCIUM 8.4*   No results for input(s): "LABPT", "INR" in the last 72 hours.  Exam: General - Patient is Alert and Oriented Extremity - Neurologically intact Sensation intact distally Intact pulses distally Dorsiflexion/Plantar flexion intact Dressing - dressing C/D/I Motor Function - intact, moving foot and toes well on exam.   Past Medical History:  Diagnosis Date   Acute bronchitis    Anemia    Anxiety    CHF (congestive heart failure) (HCC)    chronic LBBB (left bundle branch block)    COPD (chronic obstructive pulmonary disease) (HCC)    Drug use    Epistaxis 11/22/2018   GERD (gastroesophageal reflux disease)    History of vitamin D deficiency     Hyperlipidemia    Hypertension    Hypertensive heart disease with heart failure ([place]) 08/29/2018   Essential hypertension   Impaired glucose tolerance    Left ventricular dysfunction 08/29/2018   Left ventricular dysfunction   Leukemia (HCC)    LVH (left ventricular hypertrophy)    Menopause    Paresthesia of hand    left   Pneumonia of right lower lobe due to infectious organism 10/23/2018   Pre-diabetes    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   Primary osteoarthritis of right hip 05/01/2018   Severe mitral regurgitation 08/29/2018   Mitral regurgitation   Stroke [facility])    Tobacco abuse 08/29/2018   Tobacco abuse    Assessment/Plan: 1 Day Post-Op Procedure(s) (LRB): TOTAL HIP ARTHROPLASTY ANTERIOR APPROACH (Left) Principal Problem:   S/P total left hip arthroplasty  Estimated body mass index is 26.22 kg/m as calculated from the following:   Height as of this encounter: '5\' 3"'$  (1.6 m).   Weight as of this encounter: 67.1 kg. Advance diet Up with therapy D/C IV fluids  DVT Prophylaxis - Aspirin Weight bearing as tolerated.  Hgb stable at 10.3 this AM.  Patient with increased pain overnight, was only getting 1-2 mg of dilaudid and was changed to oxycodone 5 mg. Will send with oxycodone.   Plan is to go Home after hospital  stay. Plan for discharge today after meeting goals with therapy. Follow up in the office in 2 weeks.   Griffith Citron, PA-C Orthopedic Surgery [phone] 01/06/2022, 8:11 AM

## 2022-01-06 NOTE — TOC Transition Note (Signed)
Transition of Care [facility]) - CM/SW Discharge Note   Patient Details  Name: Yvonne Campbell MRN: [id number] Date of Birth: [birth year]  Transition of Care [facility]) CM/SW Contact:  Lennart Pall, LCSW Phone Number: 01/06/2022, 9:46 AM   Clinical Narrative:    Met briefly with pt and confirming she has all needed DME at home.  Plan for HEP.  No TOC needs.   Final next level of care: Home/Self Care Barriers to Discharge: No Barriers Identified   Patient Goals and CMS Choice Patient states their goals for this hospitalization and ongoing recovery are:: return home      Discharge Placement                       Discharge Plan and Services                DME Arranged: N/A DME Agency: NA                  Social Determinants of Health (SDOH) Interventions     Readmission Risk Interventions     No data to display

## 2022-01-06 NOTE — Progress Notes (Signed)
The patient is requesting additional pain medication for a pain level of 10. Paged Emerge.

## 2022-01-06 NOTE — Progress Notes (Signed)
Provided discharge education/instructions, all questions and concerns addressed. Pt not in acute distress, discharged home with belongings. 

## 2022-01-06 NOTE — Plan of Care (Signed)

## 2022-01-06 NOTE — Progress Notes (Signed)
Physical Therapy Treatment Patient Details Name: Yvonne Campbell MRN: [id number] DOB: May 08, 1951 Today's Date: 01/06/2022   History of Present Illness 71 y.o. female admitted 01/05/22 for L AA-THA. PMH: R AA-THA 08/25/21, HTN, drug and tobacco use, OA, R reverse TSA.    PT Comments    Pt is progressing well with mobility, she ambulated 180' with RW, no loss of balance. Stair training completed. Pt demonstrates good understanding of HEP. She is ready to DC home from a PT standpoint.    Recommendations for follow up therapy are one component of a multi-disciplinary discharge planning process, led by the attending physician.  Recommendations may be updated based on patient status, additional functional criteria and insurance authorization.  Follow Up Recommendations  Follow physician's recommendations for discharge plan and follow up therapies     Assistance Recommended at Discharge Set up Supervision/Assistance  Patient can return home with the following A little help with bathing/dressing/bathroom;Assistance with cooking/housework;Assist for transportation   Equipment Recommendations  None recommended by PT    Recommendations for Other Services       Precautions / Restrictions Precautions Precautions: Fall Precaution Comments: pt denies falls in past 6 months Restrictions Weight Bearing Restrictions: No LLE Weight Bearing: Weight bearing as tolerated     Mobility  Bed Mobility Overal bed mobility: Modified Independent             General bed mobility comments: HOB up, used rail    Transfers Overall transfer level: Needs assistance Equipment used: Rolling walker (2 wheels) Transfers: Sit to/from Stand Sit to Stand: Supervision           General transfer comment: VCs hand placement    Ambulation/Gait Ambulation/Gait assistance: Modified independent (Device/Increase time) Gait Distance (Feet): 180 Feet Assistive device: Rolling walker (2 wheels) Gait  Pattern/deviations: Step-to pattern, Decreased stride length Gait velocity: decr     General Gait Details: steady with RW, no loss of balance   Stairs Stairs: Yes Stairs assistance: Supervision Stair Management: Step to pattern, Forwards, With cane, One rail Right Number of Stairs: 3 General stair comments: VCs for sequencing   Wheelchair Mobility    Modified Rankin (Stroke Patients Only)       Balance Overall balance assessment: Modified Independent                                          Cognition Arousal/Alertness: Awake/alert Behavior During Therapy: WFL for tasks assessed/performed Overall Cognitive Status: Within Functional Limits for tasks assessed                                          Exercises Total Joint Exercises Ankle Circles/Pumps: AROM, Both, 10 reps, Supine Quad Sets: AROM, Left, 5 reps, Supine Short Arc Quad: AROM, Left, 5 reps, Supine Heel Slides: AAROM, Left, 10 reps, Supine Hip ABduction/ADduction: AAROM, Left, Supine, 10 reps Long Arc Quad: AROM, Left, 10 reps, Seated    General Comments        Pertinent Vitals/Pain Pain Assessment Pain Assessment: 0-10 Pain Score: 7  Pain Location: L hip Pain Descriptors / Indicators: Sore Pain Intervention(s): Limited activity within patient's tolerance, Monitored during session, Premedicated before session, Ice applied    Home Living  Prior Function            PT Goals (current goals can now be found in the care plan section) Acute Rehab PT Goals Patient Stated Goal: to go on a cruise PT Goal Formulation: With patient Time For Goal Achievement: 01/12/22 Potential to Achieve Goals: Good Progress towards PT goals: Progressing toward goals    Frequency    7X/week      PT Plan Current plan remains appropriate    Co-evaluation              AM-PAC PT "6 Clicks" Mobility   Outcome Measure  Help needed  turning from your back to your side while in a flat bed without using bedrails?: None Help needed moving from lying on your back to sitting on the side of a flat bed without using bedrails?: A Little Help needed moving to and from a bed to a chair (including a wheelchair)?: None Help needed standing up from a chair using your arms (e.g., wheelchair or bedside chair)?: None Help needed to walk in hospital room?: None Help needed climbing 3-5 steps with a railing? : A Little 6 Click Score: 22    End of Session Equipment Utilized During Treatment: Gait belt Activity Tolerance: Patient tolerated treatment well Patient left: in chair;with chair alarm set;with call bell/phone within reach Nurse Communication: Mobility status PT Visit Diagnosis: Difficulty in walking, not elsewhere classified (R26.2);Pain Pain - Right/Left: Left Pain - part of body: Hip     Time: 6389-3734 PT Time Calculation (min) (ACUTE ONLY): 25 min  Charges:  $Gait Training: 8-22 mins $Therapeutic Exercise: 8-22 mins                     Blondell Reveal Kistler PT 01/06/2022  Acute Rehabilitation Services Pager [phone] Office [phone]

## 2022-01-14 NOTE — Discharge Summary (Cosign Needed)
Patient ID: Yvonne Campbell MRN: [id number] DOB/AGE: 02/26/1951 71 y.o.  Admit date: 01/05/2022 Discharge date: 01/06/2022  Admission Diagnoses:  Left hip osteoarthritis  Discharge Diagnoses:  Principal Problem:   S/P total left hip arthroplasty   Past Medical History:  Diagnosis Date   Acute bronchitis    Anemia    Anxiety    CHF (congestive heart failure) (HCC)    chronic LBBB (left bundle branch block)    COPD (chronic obstructive pulmonary disease) ([place])    Drug use    Epistaxis 11/22/2018   GERD (gastroesophageal reflux disease)    History of vitamin D deficiency    Hyperlipidemia    Hypertension    Hypertensive heart disease with heart failure ([place]) 08/29/2018   Essential hypertension   Impaired glucose tolerance    Left ventricular dysfunction 08/29/2018   Left ventricular dysfunction   Leukemia (HCC)    LVH (left ventricular hypertrophy)    Menopause    Paresthesia of hand    left   Pneumonia of right lower lobe due to infectious organism 10/23/2018   Pre-diabetes    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   Primary osteoarthritis of right hip 05/01/2018   Severe mitral regurgitation 08/29/2018   Mitral regurgitation   Stroke [facility])    Tobacco abuse 08/29/2018   Tobacco abuse    Surgeries: Procedure(s): TOTAL HIP ARTHROPLASTY ANTERIOR APPROACH on 01/05/2022   Consultants:   Discharged Condition: Improved  Hospital Course: Yvonne Campbell is an 71 y.o. female who was admitted 01/05/2022 for operative treatment ofS/P total left hip arthroplasty. Patient has severe unremitting pain that affects sleep, daily activities, and work/hobbies. After pre-op clearance the patient was taken to the operating room on 01/05/2022 and underwent  Procedure(s): TOTAL HIP ARTHROPLASTY ANTERIOR APPROACH.    Patient was given perioperative antibiotics:  Anti-infectives (From admission, onward)    Start     Dose/Rate Route Frequency Ordered Stop   01/05/22 1800   ceFAZolin (ANCEF) IVPB 2g/100 mL premix        2 g 200 mL/hr over 30 Minutes Intravenous Every 6 hours 01/05/22 1431 01/06/22 1016   01/05/22 0900  ceFAZolin (ANCEF) IVPB 2g/100 mL premix        2 g 200 mL/hr over 30 Minutes Intravenous On call to O.R. 01/05/22 0848 01/05/22 1200        Patient was given sequential compression devices, early ambulation, and chemoprophylaxis to prevent DVT. Patient worked with PT and was meeting their goals regarding safe ambulation and transfers.  Patient benefited maximally from hospital stay and there were no complications.    Recent vital signs: No data found.   Recent laboratory studies: No results for input(s): "WBC", "HGB", "HCT", "PLT", "NA", "K", "CL", "CO2", "BUN", "CREATININE", "GLUCOSE", "INR", "CALCIUM" in the last 72 hours.  Invalid input(s): "PT", "2"   Discharge Medications:   Allergies as of 01/06/2022       Reactions   Antiseborrheic Products, Misc. Swelling   Latex Rash   Penicillins Swelling   Did it involve swelling of the face/tongue/throat, SOB, or low BP? Unknown Did it involve sudden or severe rash/hives, skin peeling, or any reaction on the inside of your mouth or nose? Unknown Did you need to seek medical attention at a hospital or doctor's office? Unknown When did it last happen?      unk If all above answers are "NO", may proceed with cephalosporin use. Tolerated Cephalosporin 08/25/21   Sulfa Antibiotics Itching, Rash  Medication List     STOP taking these medications    HYDROcodone-acetaminophen 7.5-325 MG tablet Commonly known as: NORCO   methocarbamol 500 MG tablet Commonly known as: ROBAXIN   naproxen 500 MG EC tablet Commonly known as: EC NAPROSYN       TAKE these medications    acetaminophen 500 MG tablet Commonly known as: TYLENOL Take 2 tablets (1,000 mg total) by mouth every 6 (six) hours as needed. What changed:  how much to take when to take this reasons to take this Notes to  patient: Last dose given 06/14 06:23am   aspirin 81 MG chewable tablet Chew 1 tablet (81 mg total) by mouth 2 (two) times daily for 28 days. For prevention of blood clot Notes to patient: 06/14 bedtime   atorvastatin 10 MG tablet Commonly known as: LIPITOR Take 10 mg by mouth at bedtime.   carvedilol 25 MG tablet Commonly known as: COREG Take 25 mg by mouth 2 (two) times daily.   docusate sodium 100 MG capsule Commonly known as: COLACE Take 1 capsule (100 mg total) by mouth 2 (two) times daily.   Entresto 97-103 MG Generic drug: sacubitril-valsartan Take 1 tablet by mouth 2 (two) times a day.   fluticasone furoate-vilanterol 100-25 MCG/ACT Aepb Commonly known as: BREO ELLIPTA Inhale 1 puff into the lungs daily.   gabapentin 300 MG capsule Commonly known as: NEURONTIN Take 600 mg by mouth 2 (two) times daily.   omeprazole 40 MG capsule Commonly known as: PRILOSEC Take 40 mg by mouth daily.   oxyCODONE 5 MG immediate release tablet Commonly known as: Oxy IR/ROXICODONE Take 1 tablet (5 mg total) by mouth every 4 (four) hours as needed for severe pain. Notes to patient: Last dose given 06/14 06:23am   polyethylene glycol 17 g packet Commonly known as: MIRALAX / GLYCOLAX Take 17 g by mouth daily as needed for mild constipation. Notes to patient: Resume home regimen   ProAir HFA 108 (90 Base) MCG/ACT inhaler Generic drug: albuterol Inhale 2 puffs into the lungs every 6 (six) hours as needed for wheezing or shortness of breath. Notes to patient: Resume home regimen   tiZANidine 4 MG tablet Commonly known as: ZANAFLEX Take 1 tablet (4 mg total) by mouth every 8 (eight) hours as needed for muscle spasms. Notes to patient: Last dose given 06/13 11:06pm   venlafaxine 75 MG tablet Commonly known as: EFFEXOR Take 75 mg by mouth daily.               Discharge Care Instructions  (From admission, onward)           Start     Ordered   01/06/22 0000  Change  dressing       Comments: Maintain surgical dressing until follow up in the clinic. If the edges start to pull up, may reinforce with tape. If the dressing is no longer working, may remove and cover with gauze and tape, but must keep the area dry and clean.  Call with any questions or concerns.   01/06/22 0817            Diagnostic Studies: DG Pelvis Portable  Result Date: 01/05/2022 CLINICAL DATA:  Status post left hip replacement. Imaged on in PACU. EXAM: PORTABLE PELVIS 1-2 VIEWS COMPARISON:  AP view of the bilateral hips 08/25/2021 FINDINGS: Interval total left no perihardware lucency is seen to indicate hardware failure or loosening. Expected postoperative changes including lateral left hip soft tissue swelling and subcutaneous air. Redemonstration  of total right hip arthroplasty. Oral contrast is partially visualized within the colon. Hip arthroplasty. IMPRESSION: New total left hip arthroplasty without evidence of hardware failure. Electronically Signed   By: Yvonne Kendall M.D.   On: 01/05/2022 14:24   DG HIP UNILAT WITH PELVIS 1V LEFT  Result Date: 01/05/2022 CLINICAL DATA:  Left total hip arthroplasty. EXAM: DG HIP (WITH OR WITHOUT PELVIS) 1V*L* COMPARISON:  Pelvic radiographs 08/25/2021. FINDINGS: C-arm fluoroscopy provided in the operating room. 10 seconds of fluoroscopy time. 1.2345 mGy. Four spot fluoroscopic images of the lower pelvis are submitted. Initial images demonstrate stable appearance of the previous right total hip arthroplasty. Subsequent images demonstrate the performance of a left total hip arthroplasty with a screw fixed acetabular component. The femoral head component has not yet been placed. No complications are identified. IMPRESSION: Intraoperative views during left total hip arthroplasty. No demonstrated complications. Electronically Signed   By: Richardean Sale M.D.   On: 01/05/2022 12:47   DG C-Arm 1-60 Min-No Report  Result Date: 01/05/2022 Fluoroscopy was  utilized by the requesting physician.  No radiographic interpretation.   DG C-Arm 1-60 Min-No Report  Result Date: 01/05/2022 Fluoroscopy was utilized by the requesting physician.  No radiographic interpretation.    Disposition: Discharge disposition: 01-Home or Self Care       Discharge Instructions     Call MD / Call 911   Complete by: As directed    If you experience chest pain or shortness of breath, CALL 911 and be transported to the hospital emergency room.  If you develope a fever above 101 F, pus (white drainage) or increased drainage or redness at the wound, or calf pain, call your surgeon's office.   Change dressing   Complete by: As directed    Maintain surgical dressing until follow up in the clinic. If the edges start to pull up, may reinforce with tape. If the dressing is no longer working, may remove and cover with gauze and tape, but must keep the area dry and clean.  Call with any questions or concerns.   Constipation Prevention   Complete by: As directed    Drink plenty of fluids.  Prune juice may be helpful.  You may use a stool softener, such as Colace (over the counter) 100 mg twice a day.  Use MiraLax (over the counter) for constipation as needed.   Diet - low sodium heart healthy   Complete by: As directed    Increase activity slowly as tolerated   Complete by: As directed    Weight bearing as tolerated with assist device (walker, cane, etc) as directed, use it as long as suggested by your surgeon or therapist, typically at least 4-6 weeks.   Post-operative opioid taper instructions:   Complete by: As directed    POST-OPERATIVE OPIOID TAPER INSTRUCTIONS: It is important to wean off of your opioid medication as soon as possible. If you do not need pain medication after your surgery it is ok to stop day one. Opioids include: Codeine, Hydrocodone(Norco, Vicodin), Oxycodone(Percocet, oxycontin) and hydromorphone amongst others.  Long term and even short term use  of opiods can cause: Increased pain response Dependence Constipation Depression Respiratory depression And more.  Withdrawal symptoms can include Flu like symptoms Nausea, vomiting And more Techniques to manage these symptoms Hydrate well Eat regular healthy meals Stay active Use relaxation techniques(deep breathing, meditating, yoga) Do Not substitute Alcohol to help with tapering If you have been on opioids for less than two weeks and  do not have pain than it is ok to stop all together.  Plan to wean off of opioids This plan should start within one week post op of your joint replacement. Maintain the same interval or time between taking each dose and first decrease the dose.  Cut the total daily intake of opioids by one tablet each day Next start to increase the time between doses. The last dose that should be eliminated is the evening dose.      TED hose   Complete by: As directed    Use stockings (TED hose) for 2 weeks on both leg(s).  You may remove them at night for sleeping.        Follow-up Information     Paralee Cancel, MD Follow up.   Specialty: Orthopedic Surgery Contact information: [address] [place] [address] [phone]                  Signed: Irving Copas 01/14/2022, 10:51 AM

## 2023-03-23 IMAGING — RF DG HIP (WITH OR WITHOUT PELVIS) 1V*L*
1 series · 4 of 4 positions shown · non-contrast
Comparison: Pelvic radiographs 08/25/2021.

CLINICAL DATA: Left total hip arthroplasty.

EXAM:
DG HIP (WITH OR WITHOUT PELVIS) 1V*L*

[Series 1: unknown protocol · 0.20mm/px · 4 of 4 slices shown]
[im 1/4]
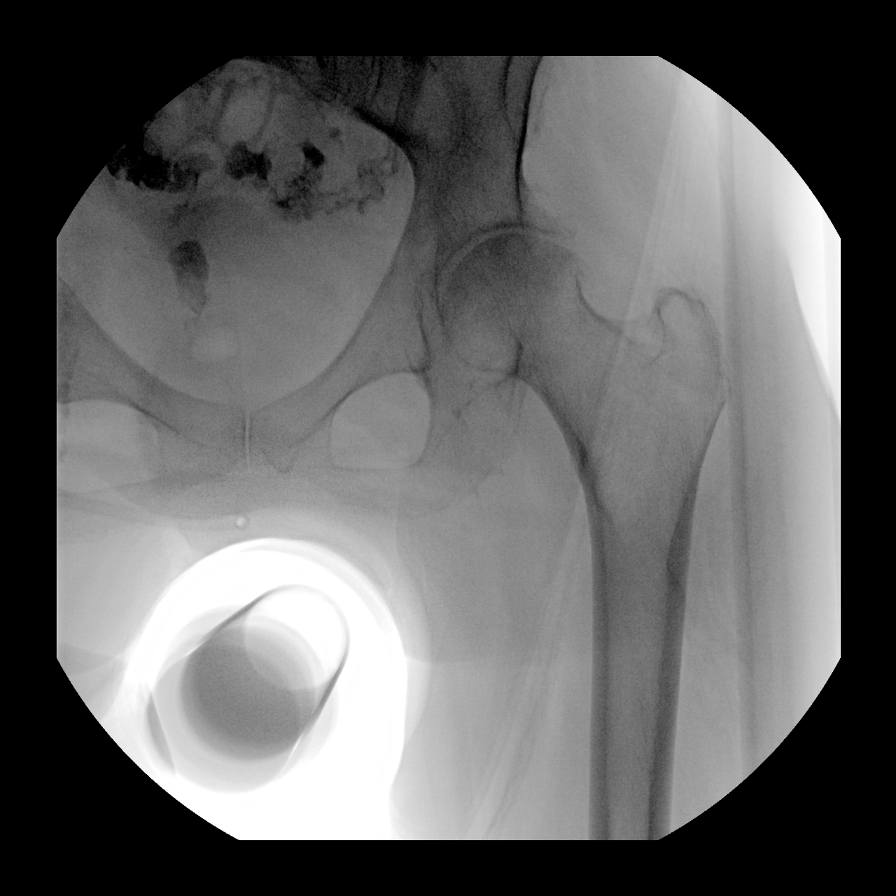
[im 2/4]
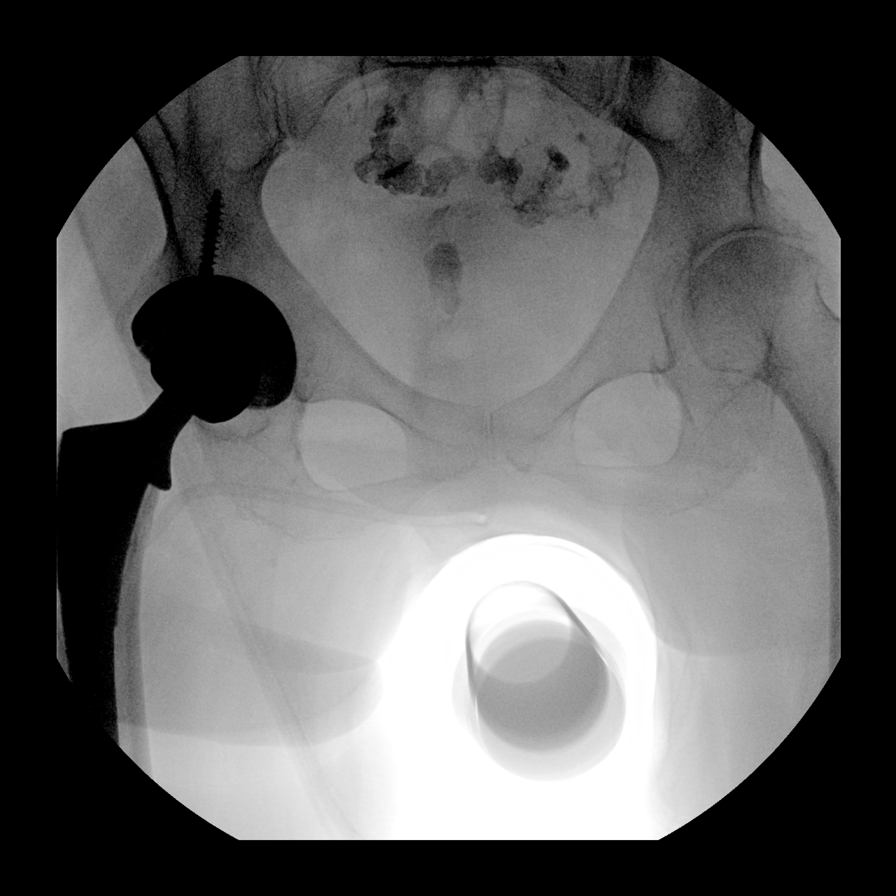
[im 3/4]
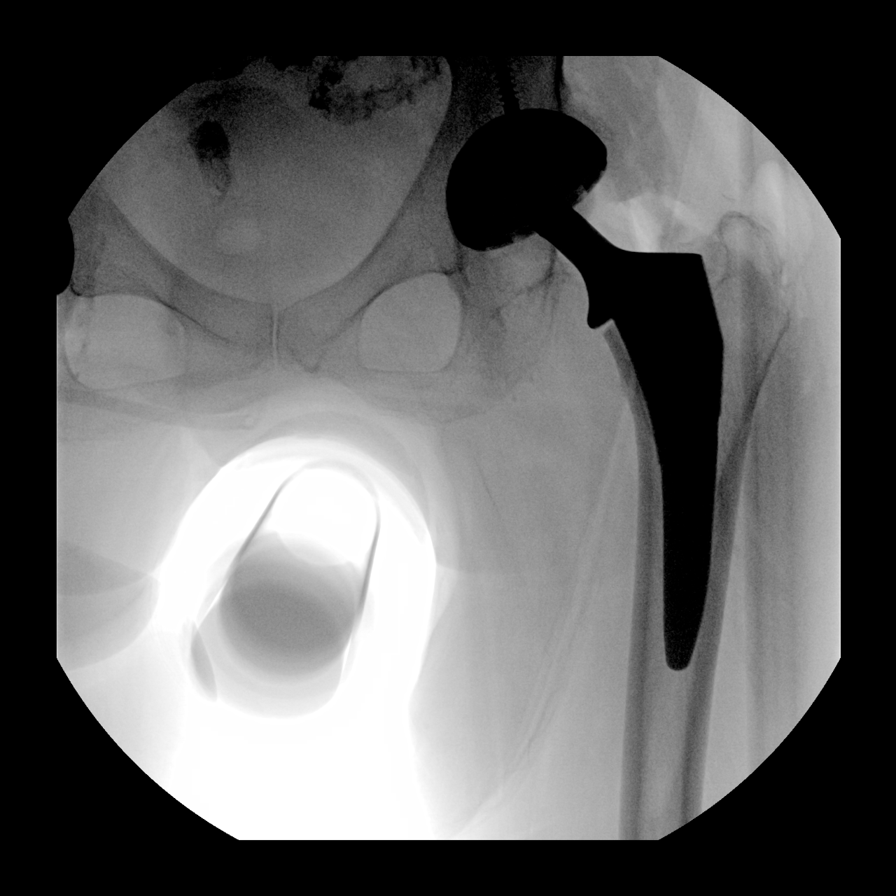
[im 4/4]
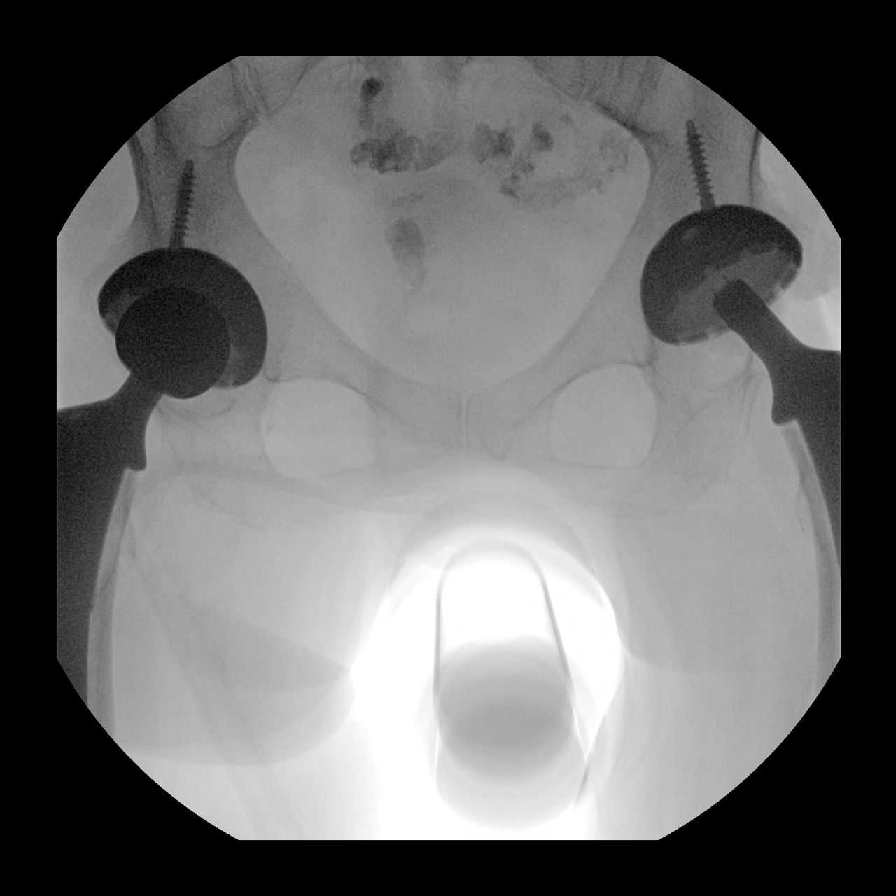

[4 of 4 positions shown; findings below may reference images not displayed]

FINDINGS: C-arm fluoroscopy provided in the operating [HOSPITAL] seconds of
fluoroscopy time. 1.2345 mGy.

Four spot fluoroscopic images of the lower pelvis are submitted.
Initial images demonstrate stable appearance of the previous right
total hip arthroplasty. Subsequent images demonstrate the
performance of a left total hip arthroplasty with a screw fixed
acetabular component. The femoral head component has not yet been
placed. No complications are identified.
IMPRESSION: Intraoperative views during left total hip arthroplasty. No
demonstrated complications.
